# Patient Record
Sex: Male | Born: 1950 | Race: White | Hispanic: No | Marital: Married | Smoking: Never smoker
Health system: Southern US, Community
[De-identification: ages and names within clinical notes are randomized; demographics above are authoritative.]

## PROBLEM LIST (undated history)

## (undated) DIAGNOSIS — Z0000 Encounter for general adult medical examination without abnormal findings: Secondary | ICD-10-CM

## (undated) DIAGNOSIS — N529 Male erectile dysfunction, unspecified: Secondary | ICD-10-CM

## (undated) DIAGNOSIS — E785 Hyperlipidemia, unspecified: Secondary | ICD-10-CM

## (undated) DIAGNOSIS — B029 Zoster without complications: Secondary | ICD-10-CM

## (undated) DIAGNOSIS — Z8709 Personal history of other diseases of the respiratory system: Secondary | ICD-10-CM

## (undated) HISTORY — PX: WISDOM TOOTH EXTRACTION: SHX21

## (undated) HISTORY — PX: NO PAST SURGERIES: SHX2092

## (undated) HISTORY — DX: Hyperlipidemia, unspecified: E78.5

## (undated) HISTORY — DX: Encounter for general adult medical examination without abnormal findings: Z00.00

## (undated) HISTORY — PX: COLONOSCOPY: SHX174

## (undated) HISTORY — DX: Zoster without complications: B02.9

## (undated) HISTORY — DX: Male erectile dysfunction, unspecified: N52.9

## (undated) HISTORY — DX: Personal history of other diseases of the respiratory system: Z87.09

---

## 2009-02-24 ENCOUNTER — Encounter: Admission: RE | Admit: 2009-02-24 | Discharge: 2009-02-24 | Payer: Self-pay | Admitting: Cardiology

## 2009-04-04 LAB — HM COLONOSCOPY

## 2010-09-10 ENCOUNTER — Telehealth: Payer: Self-pay | Admitting: Cardiology

## 2010-09-10 NOTE — Telephone Encounter (Signed)
Pt is leaving town late Monday afternoon to go out of town for 10 days and needs two Z Paks called into pharmacy prior to his leaving.   Pharmacy is [facility] on [address]  Pt would like a call back to confirm this has been done.  Pt is aware since after 4pm may not receive call until tomorrow.

## 2010-09-11 NOTE — Telephone Encounter (Signed)
For him and Candace, ? Ok to Rx

## 2010-09-12 NOTE — Telephone Encounter (Signed)
Yes okay to prescribe Z-Pak for [place] and for [employer]

## 2010-09-14 MED ORDER — AZITHROMYCIN 250 MG PO TABS: ORAL_TABLET | ORAL | Status: DC

## 2010-09-14 NOTE — Telephone Encounter (Signed)
Called in at request of patient

## 2010-12-03 ENCOUNTER — Encounter: Payer: Self-pay | Admitting: *Deleted

## 2010-12-04 ENCOUNTER — Other Ambulatory Visit: Payer: Self-pay | Admitting: *Deleted

## 2010-12-04 ENCOUNTER — Other Ambulatory Visit (INDEPENDENT_AMBULATORY_CARE_PROVIDER_SITE_OTHER): Payer: PRIVATE HEALTH INSURANCE | Admitting: *Deleted

## 2010-12-04 ENCOUNTER — Other Ambulatory Visit: Payer: Self-pay | Admitting: Cardiology

## 2010-12-04 DIAGNOSIS — E7800 Pure hypercholesterolemia, unspecified: Secondary | ICD-10-CM

## 2010-12-04 LAB — HEPATIC FUNCTION PANEL
ALT: 20 U/L (ref 0–53)
AST: 24 U/L (ref 0–37)
Albumin: 4.2 g/dL (ref 3.5–5.2)
Alkaline Phosphatase: 84 U/L (ref 39–117)
Bilirubin, Direct: 0.1 mg/dL (ref 0.0–0.3)
Total Bilirubin: 0.7 mg/dL (ref 0.3–1.2)
Total Protein: 7.3 g/dL (ref 6.0–8.3)

## 2010-12-04 LAB — LIPID PANEL
Cholesterol: 225 mg/dL — ABNORMAL HIGH (ref 0–200)
Triglycerides: 218 mg/dL — ABNORMAL HIGH (ref 0.0–149.0)
VLDL: 43.6 mg/dL — ABNORMAL HIGH (ref 0.0–40.0)

## 2010-12-04 LAB — BASIC METABOLIC PANEL
BUN: 15 mg/dL (ref 6–23)
Calcium: 9.1 mg/dL (ref 8.4–10.5)
Creatinine, Ser: 1 mg/dL (ref 0.4–1.5)
GFR: 85.8 mL/min (ref >60.00)
Glucose, Bld: 96 mg/dL (ref 70–99)
Potassium: 3.8 mEq/L (ref 3.5–5.1)

## 2010-12-04 LAB — LDL CHOLESTEROL, DIRECT: Direct LDL: 145.6 mg/dL

## 2010-12-07 ENCOUNTER — Encounter: Payer: Self-pay | Admitting: Cardiology

## 2010-12-07 ENCOUNTER — Other Ambulatory Visit: Payer: Self-pay | Admitting: *Deleted

## 2010-12-07 ENCOUNTER — Ambulatory Visit (INDEPENDENT_AMBULATORY_CARE_PROVIDER_SITE_OTHER): Payer: BC Managed Care – PPO | Admitting: Cardiology

## 2010-12-07 VITALS — BP 118/86 | HR 70 | Ht 72.0 in | Wt 226.0 lb

## 2010-12-07 DIAGNOSIS — E785 Hyperlipidemia, unspecified: Secondary | ICD-10-CM

## 2010-12-07 DIAGNOSIS — E7800 Pure hypercholesterolemia, unspecified: Secondary | ICD-10-CM

## 2010-12-07 DIAGNOSIS — R05 Cough: Secondary | ICD-10-CM

## 2010-12-07 NOTE — Assessment & Plan Note (Signed)
The patient has a history of dyslipidemia.  Since I last saw him his cholesterol and triglyceride numbers have increased and his weight is up 6 pounds.

## 2010-12-07 NOTE — Assessment & Plan Note (Signed)
The patient has had a cough for several weeks.  Is not bringing up any sputum.  He is not on an ACE inhibitor.  We will plan to get a CBC today and a chest x-ray later this week.

## 2010-12-07 NOTE — Progress Notes (Signed)
Mario Young Date of Birth:  [birth year] [facility] [address]   [address] [place], [place]  [address] [phone]           Fax   [phone]  History of Present Illness: This pleasant 60 year old gentleman is seen after a long absence.  He has a history of hypercholesterolemia and exogenous obesity.  He does not have any history of ischemic heart disease.  He had a normal Cardiolite stress test in 2001.  He gets regular exercise working out and also plays golf.  When he does play golf he walks and carries his bag.  Current Outpatient Prescriptions  Medication Sig Dispense Refill  . aspirin 81 MG tablet Take 81 mg by mouth daily.        . Multiple Vitamin (MULTIVITAMIN) tablet Take 1 tablet by mouth daily.          Allergies  Allergen Reactions  . Lopressor (Metoprolol Tartrate)     nausea    There is no problem list on file for this patient.   History  Smoking status  . Never Smoker   Smokeless tobacco  . Not on file    History  Alcohol Use     No family history on file.  Review of Systems: Constitutional: no fever chills diaphoresis or fatigue or change in weight.  Head and neck: no hearing loss, no epistaxis, no photophobia or visual disturbance. Respiratory: No cough, shortness of breath or wheezing. Cardiovascular: No chest pain peripheral edema, palpitations. Gastrointestinal: No abdominal distention, no abdominal pain, no change in bowel habits hematochezia or melena. Genitourinary: No dysuria, no frequency, no urgency, no nocturia. Musculoskeletal:No arthralgias, no back pain, no gait disturbance or myalgias. Neurological: No dizziness, no headaches, no numbness, no seizures, no syncope, no weakness, no tremors. Hematologic: No lymphadenopathy, no easy bruising. Psychiatric: No confusion, no hallucinations, no sleep disturbance.    Physical Exam: Filed Vitals:   12/07/10 1544  BP: 118/86  Pulse: 70   the general  appearance reveals a well-developed well-nourished gentleman in no distress.Pupils equal and reactive.   Extraocular Movements are full.  There is no scleral icterus.  The mouth and pharynx are normal.  The neck is supple.  The carotids reveal no bruits.  The jugular venous pressure is normal.  The thyroid is not enlarged.  There is no lymphadenopathy.  The chest is clear to percussion and auscultation. There are no rales or rhonchi. Expansion of the chest is symmetrical.  The precordium is quiet.  The first heart sound is normal.  The second heart sound is physiologically split.  There is no murmur gallop rub or click.  There is no abnormal lift or heave.  The abdomen is soft and nontender. Bowel sounds are normal. The liver and spleen are not enlarged. There Are no abdominal masses. There are no bruits.  Rectal exam reveals normal prostate stool is brown and heme-negative.  Normal extremity.  Good pedal pulses and no phlebitis or edema. Strength is normal and symmetrical in all extremities.  There is no lateralizing weakness.  There are no sensory deficits.  The skin is warm and dry.  There is no rash.  EKG shows normal sinus rhythm and is within normal limits   Assessment / Plan:   good general health.  He does have significant elevation of his lipids.  He would like to try harder on weight loss and diet before going on medication.  He worked hard over the  next 6 months to get his weight down and cut back on carbohydrates and also avoid high cholesterol foods.  Recheck in 6 months for followup office visit and lipid panel.  He will also be seeing about getting a primary care physician for his noncardiac needs

## 2010-12-07 NOTE — Patient Instructions (Addendum)
Your physician recommends that you continue on your current medications as directed. Please refer to the Current Medication list given to you today. Your physician wants you to follow-up in: 6 month You will receive a reminder letter in the mail two months in advance. If you don't receive a letter, please call our office to schedule the follow-up appointment. Will obtain lab today and chest xray Friday and call with the results

## 2010-12-08 ENCOUNTER — Telehealth: Payer: Self-pay | Admitting: *Deleted

## 2010-12-08 LAB — CBC WITH DIFFERENTIAL/PLATELET
Basophils Absolute: 0.1 10*3/uL (ref 0.0–0.1)
Basophils Relative: 0.9 % (ref 0.0–3.0)
Eosinophils Absolute: 0.2 10*3/uL (ref 0.0–0.7)
Hemoglobin: 15.6 g/dL (ref 13.0–17.0)
Neutro Abs: 4.1 10*3/uL (ref 1.4–7.7)
RBC: 5.08 Mil/uL (ref 4.22–5.81)

## 2010-12-08 NOTE — Telephone Encounter (Signed)
Message copied by Burnell Blanks on Tue Dec 08, 2010  4:28 PM ------      Message from: Cassell Clement      Created: Tue Dec 08, 2010  4:01 PM       Please report.  The PSA is still normal at 1.20.  The CBC is normal.  There is no anemia

## 2010-12-08 NOTE — Telephone Encounter (Signed)
Advised wife of results.  

## 2010-12-11 ENCOUNTER — Ambulatory Visit
Admission: RE | Admit: 2010-12-11 | Discharge: 2010-12-11 | Disposition: A | Discharge status: Home / Self Care | Payer: BC Managed Care – PPO | Source: Ambulatory Visit | Attending: Cardiology | Admitting: Cardiology

## 2010-12-11 DIAGNOSIS — R05 Cough: Secondary | ICD-10-CM

## 2010-12-15 ENCOUNTER — Telehealth: Payer: Self-pay | Admitting: *Deleted

## 2010-12-15 NOTE — Telephone Encounter (Signed)
Advised of xray 

## 2010-12-15 NOTE — Telephone Encounter (Signed)
Message copied by Burnell Blanks on Tue Dec 15, 2010  2:06 PM ------      Message from: Cassell Clement      Created: Mon Dec 14, 2010 10:31 AM       Please report.  The chest x-ray was normal

## 2011-04-23 ENCOUNTER — Telehealth: Payer: Self-pay | Admitting: Cardiology

## 2011-04-23 DIAGNOSIS — R05 Cough: Secondary | ICD-10-CM

## 2011-04-23 MED ORDER — HYDROCOD POLST-CHLORPHEN POLST 10-8 MG/5ML PO LQCR: 5.0000 mL | Freq: Two times a day (BID) | ORAL | Status: DC | PRN

## 2011-04-23 MED ORDER — AZITHROMYCIN 250 MG PO TABS: ORAL_TABLET | ORAL | Status: AC

## 2011-04-23 NOTE — Telephone Encounter (Signed)
Advised patient

## 2011-04-23 NOTE — Telephone Encounter (Signed)
Last Saturday went to [facility] and gave prednisone shot and then 2 tabs a day for 5 days along with Doxycycline 100 mg twice a day for 10 days.  Is still coughing. Non productive cough, using Mucinex.  Only taking Doxycycline now and is not really feeling any better.  Was hoping  Dr. Patty Sermons could Rx something for cough and/or something stronger.  Will forward to  Dr. Patty Sermons for review

## 2011-04-23 NOTE — Telephone Encounter (Signed)
Pt is having a upper resp situation and needs meds called in

## 2011-04-23 NOTE — Telephone Encounter (Signed)
Keep using mucinex and add Tussionex 5 cc q12h prn.  Stop doxy and switch to Zpak

## 2011-06-25 ENCOUNTER — Telehealth: Payer: Self-pay | Admitting: Cardiology

## 2011-06-25 NOTE — Telephone Encounter (Signed)
New msg Pt wants a rx for prednisone for insect bites. Please call

## 2011-06-25 NOTE — Telephone Encounter (Signed)
Reviewed with Norma Fredrickson, NP- per Lawson Fiscal, the patient will need to go to Urgent Care for this to be done. I have left a message on the patient's identified voice mail that Dr. Patty Sermons is out of the office today and that his NP states he will need to obtain this through Urgent Care. Sherri Rad, RN, BSN  The patient called back very upset that we would not fill this for him. I reinterated to him that we are cardiology and in Dr. Yevonne Pax abscence, this would not be ordered by any of our other doctors. I re-instructed him to visit his local Urgent Care. He did hang up on me. Sherri Rad, RN, BSN

## 2011-06-28 ENCOUNTER — Telehealth: Payer: Self-pay | Admitting: Cardiology

## 2011-06-28 MED ORDER — AZITHROMYCIN 250 MG PO TABS: ORAL_TABLET | ORAL | Status: AC

## 2011-06-28 MED ORDER — METHYLPREDNISOLONE 4 MG PO KIT: PACK | ORAL | Status: AC

## 2011-06-28 NOTE — Telephone Encounter (Signed)
Pt needs a Rx for prednisone he call ed Friday and spoke with Herbert Seta and was told he had to wait til you got back

## 2011-06-28 NOTE — Telephone Encounter (Signed)
Fu call °Patient returning your call °

## 2011-06-28 NOTE — Telephone Encounter (Signed)
Okay to call in a Medrol Dosepak.

## 2011-06-28 NOTE — Telephone Encounter (Signed)
Will forward to Dr Patty Sermons for review since patient has called back requested today.  Sherri Rad, RN 06/25/2011 10:33 AM Signed  Reviewed with Norma Fredrickson, NP- per Lawson Fiscal, the patient will need to go to Urgent Care for this to be done. I have left a message on the patient's identified voice mail that Dr. Patty Sermons is out of the office today and that his NP states he will need to obtain this through Urgent Care. Sherri Rad, RN, BSN  The patient called back very upset that we would not fill this for him. I reinterated to him that we are cardiology and in Dr. Yevonne Pax abscence, this would not be ordered by any of our other doctors. I re-instructed him to visit his local Urgent Care. He did hang up on me. Sherri Rad, RN, BSN   Melissa S Hethcox 06/25/2011 9:03 AM Signed  New msg  Pt wants a rx for prednisone for insect bites. Please call

## 2011-06-28 NOTE — Telephone Encounter (Signed)
Left message to call back  

## 2011-06-28 NOTE — Telephone Encounter (Signed)
Called in Medrol to [facility].  Patient also requested Zpak for him and his wife to have on hand since they are going out of town for a few weeks.  Called to pharmacy as well

## 2012-04-24 IMAGING — CR DG CHEST 2V
2 series · 2 of 2 positions shown · non-contrast
Comparison: 02/24/2010.

CLINICAL DATA: Cough.  Physical.  Nonsmoker.

CHEST - 2 VIEW

[view not recorded (1 of 2)]
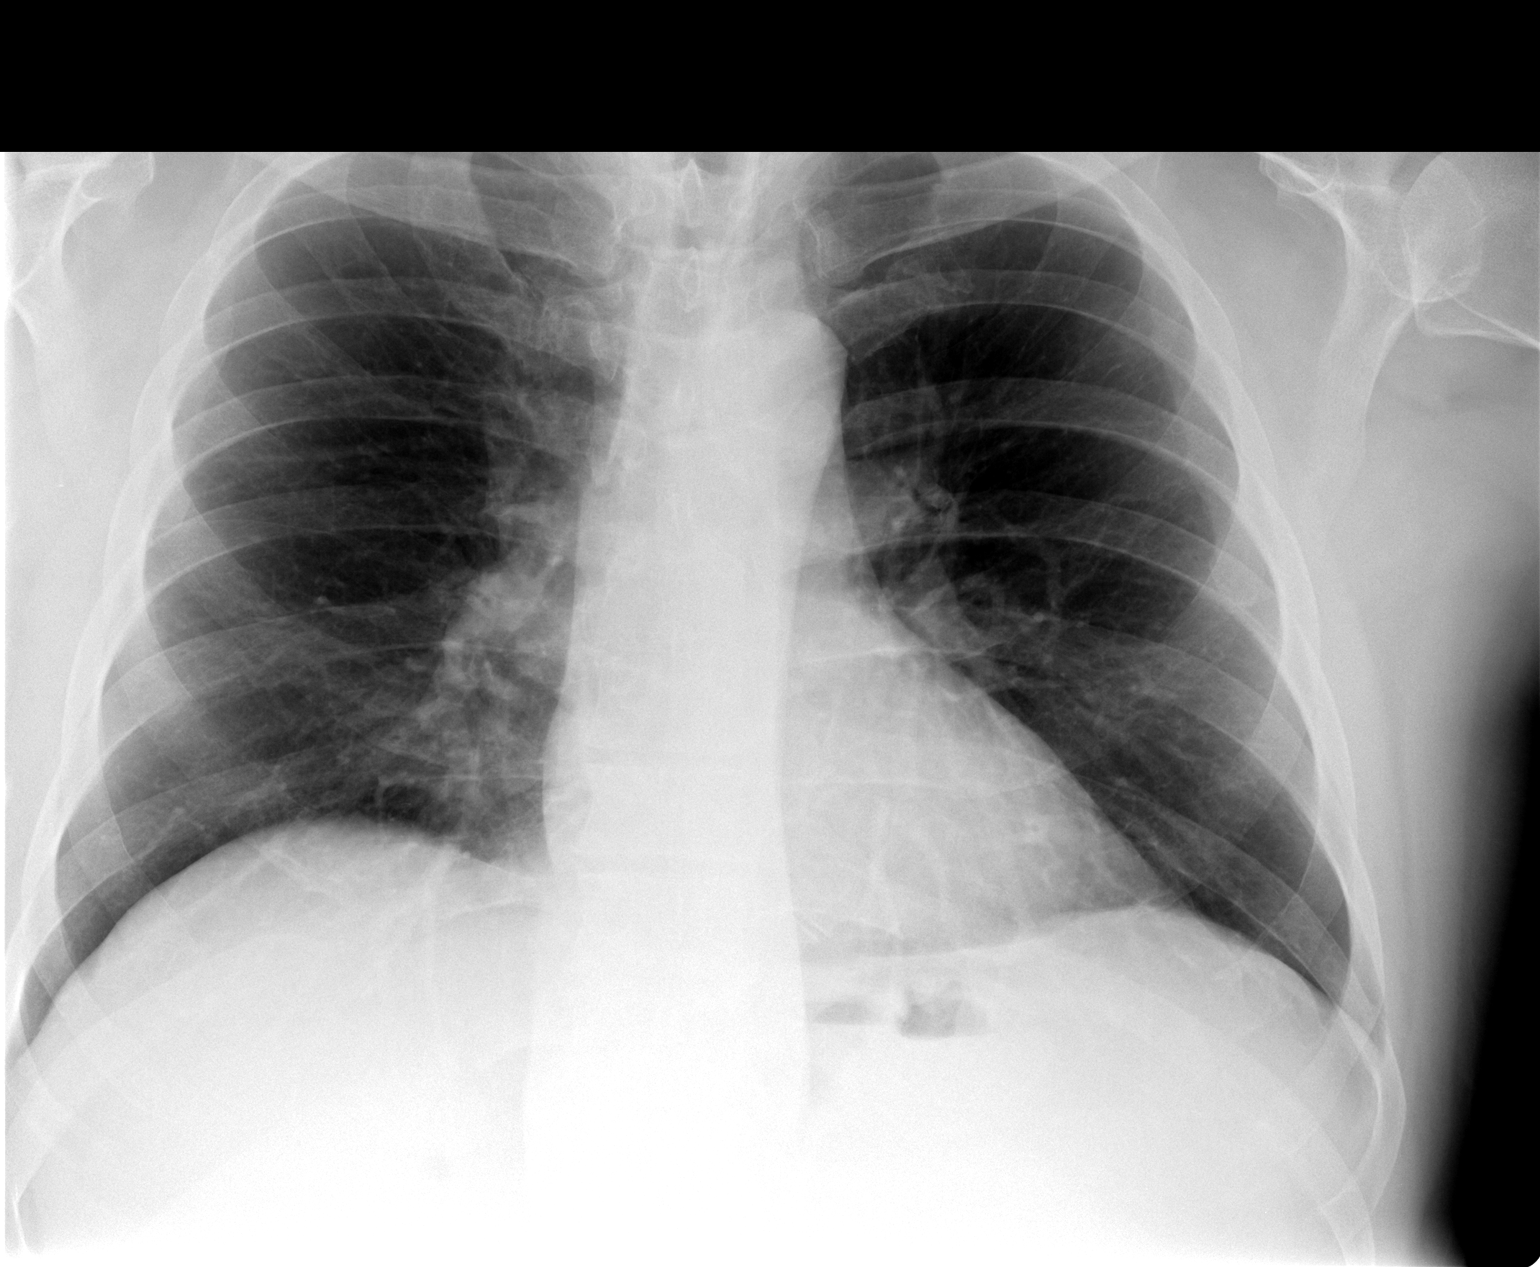

[view not recorded (2 of 2)]
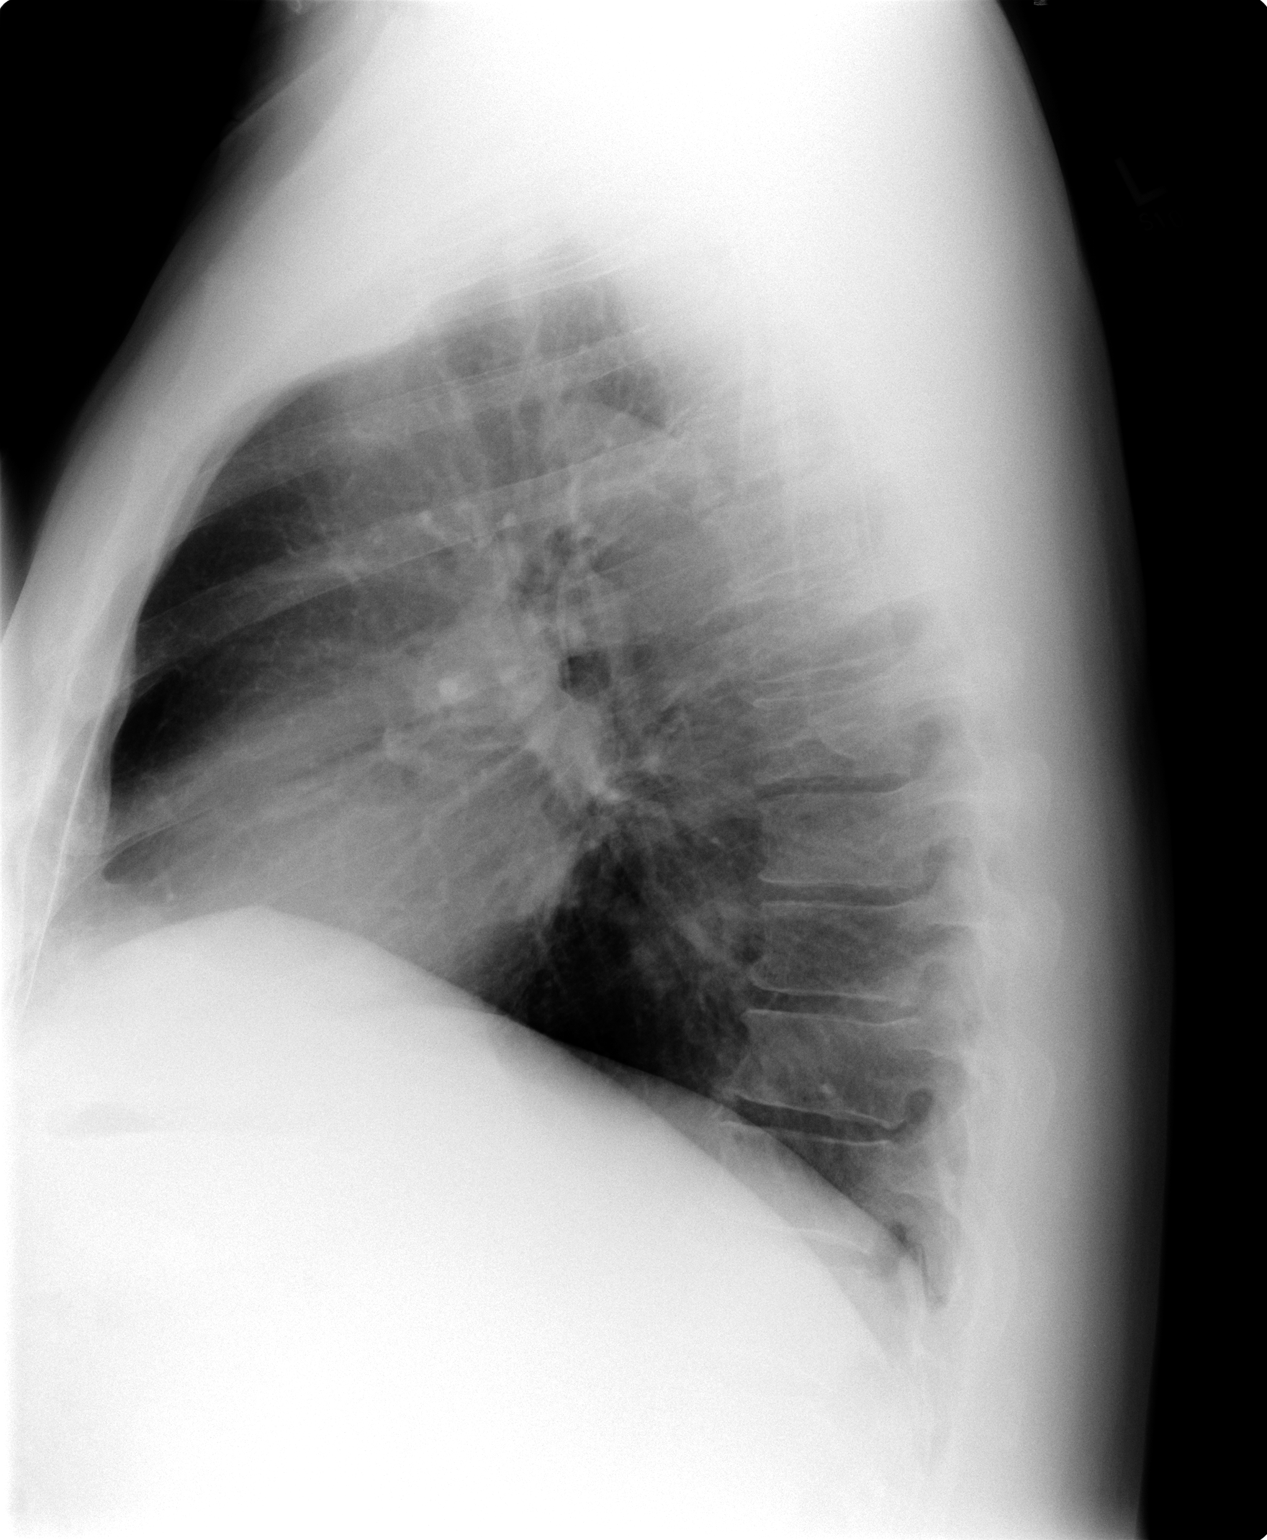

[2 of 2 positions shown; findings below may reference images not displayed]

FINDINGS: Mild central pulmonary vascular prominence. No
infiltrate, congestive heart failure or pneumothorax.  Heart size
within normal limits.
IMPRESSION: No acute abnormality.

## 2012-07-11 ENCOUNTER — Encounter: Payer: Self-pay | Admitting: Cardiology

## 2012-09-28 ENCOUNTER — Encounter: Payer: Self-pay | Admitting: Internal Medicine

## 2012-12-15 ENCOUNTER — Encounter: Payer: BC Managed Care – PPO | Admitting: Internal Medicine

## 2013-01-02 ENCOUNTER — Encounter: Payer: Self-pay | Admitting: Internal Medicine

## 2013-02-28 ENCOUNTER — Encounter: Payer: BC Managed Care – PPO | Admitting: Internal Medicine

## 2013-07-16 ENCOUNTER — Encounter: Payer: Self-pay | Admitting: Internal Medicine

## 2013-10-03 ENCOUNTER — Encounter: Payer: BC Managed Care – PPO | Admitting: Internal Medicine

## 2013-11-06 ENCOUNTER — Telehealth: Payer: Self-pay | Admitting: Internal Medicine

## 2013-11-06 NOTE — Telephone Encounter (Signed)
Received medical records from [facility], 14 pages, sent to Dr. Olevia Perches. 11/06/13/ss

## 2013-11-07 ENCOUNTER — Telehealth: Payer: Self-pay | Admitting: Internal Medicine

## 2013-11-13 NOTE — Telephone Encounter (Signed)
Lm for pt to call to sched Direct colon with Dr. Olevia Perches  Records approved

## 2014-01-14 NOTE — Telephone Encounter (Signed)
Pt scheduled then cancelled °

## 2014-04-19 ENCOUNTER — Telehealth: Payer: Self-pay | Admitting: *Deleted

## 2014-04-19 NOTE — Telephone Encounter (Addendum)
Pre visit completed under specialty comments.

## 2014-04-22 ENCOUNTER — Encounter: Payer: Self-pay | Admitting: Family Medicine

## 2014-04-22 ENCOUNTER — Encounter: Payer: Self-pay | Admitting: Gastroenterology

## 2014-04-22 ENCOUNTER — Ambulatory Visit: Payer: Self-pay | Admitting: Family Medicine

## 2014-04-22 ENCOUNTER — Ambulatory Visit (INDEPENDENT_AMBULATORY_CARE_PROVIDER_SITE_OTHER): Payer: Managed Care, Other (non HMO) | Admitting: Family Medicine

## 2014-04-22 VITALS — BP 132/82 | HR 60 | Temp 98.4°F | Ht 72.0 in | Wt 181.2 lb

## 2014-04-22 DIAGNOSIS — Z86010 Personal history of colonic polyps: Secondary | ICD-10-CM

## 2014-04-22 DIAGNOSIS — Z8709 Personal history of other diseases of the respiratory system: Secondary | ICD-10-CM

## 2014-04-22 DIAGNOSIS — E785 Hyperlipidemia, unspecified: Secondary | ICD-10-CM

## 2014-04-22 DIAGNOSIS — N529 Male erectile dysfunction, unspecified: Secondary | ICD-10-CM

## 2014-04-22 DIAGNOSIS — Z0000 Encounter for general adult medical examination without abnormal findings: Secondary | ICD-10-CM | POA: Diagnosis not present

## 2014-04-22 HISTORY — DX: Personal history of other diseases of the respiratory system: Z87.09

## 2014-04-22 MED ORDER — AZITHROMYCIN 250 MG PO TABS: ORAL_TABLET | ORAL | Status: DC

## 2014-04-22 NOTE — Telephone Encounter (Signed)
Error

## 2014-04-22 NOTE — Progress Notes (Signed)
Pre visit review using our clinic review tool, if applicable. No additional management support is needed unless otherwise documented below in the visit note. 

## 2014-04-22 NOTE — Patient Instructions (Signed)
Release of rec Dr Brigitte Pulse at [facility] A healthy lifestyle and preventive care can promote health and wellness. Preventive health guidelines for men include the following key practices:  A routine yearly physical is a good way to check with your health care provider about your health and preventative screening. It is a chance to share any concerns and updates on your health and to receive a thorough exam.  Visit your dentist for a routine exam and preventative care every 6 months. Brush your teeth twice a day and floss once a day. Good oral hygiene prevents tooth decay and gum disease.  The frequency of eye exams is based on your age, health, family medical history, use of contact lenses, and other factors. Follow your health care provider's recommendations for frequency of eye exams.  Eat a healthy diet. Foods such as vegetables, fruits, whole grains, low-fat dairy products, and lean protein foods contain the nutrients you need without too many calories. Decrease your intake of foods high in solid fats, added sugars, and salt. Eat the right amount of calories for you.Get information about a proper diet from your health care provider, if necessary.  Regular physical exercise is one of the most important things you can do for your health. Most adults should get at least 150 minutes of moderate-intensity exercise (any activity that increases your heart rate and causes you to sweat) each week. In addition, most adults need muscle-strengthening exercises on 2 or more days a week.  Maintain a healthy weight. The body mass index (BMI) is a screening tool to identify possible weight problems. It provides an estimate of body fat based on height and weight. Your health care provider can find your BMI and can help you achieve or maintain a healthy weight.For adults 20 years and older:  A BMI below 18.5 is considered underweight.  A BMI of 18.5 to 24.9 is normal.  A BMI of 25 to  29.9 is considered overweight.  A BMI of 30 and above is considered obese.  Maintain normal blood lipids and cholesterol levels by exercising and minimizing your intake of saturated fat. Eat a balanced diet with plenty of fruit and vegetables. Blood tests for lipids and cholesterol should begin at age 96 and be repeated every 5 years. If your lipid or cholesterol levels are high, you are over 50, or you are at high risk for heart disease, you may need your cholesterol levels checked more frequently.Ongoing high lipid and cholesterol levels should be treated with medicines if diet and exercise are not working.  If you smoke, find out from your health care provider how to quit. If you do not use tobacco, do not start.  Lung cancer screening is recommended for adults aged 51-80 years who are at high risk for developing lung cancer because of a history of smoking. A yearly low-dose CT scan of the lungs is recommended for people who have at least a 30-pack-year history of smoking and are a current smoker or have quit within the past 15 years. A pack year of smoking is smoking an average of 1 pack of cigarettes a day for 1 year (for example: 1 pack a day for 30 years or 2 packs a day for 15 years). Yearly screening should continue until the smoker has stopped smoking for at least 15 years. Yearly screening should be stopped for people who develop a health problem that would prevent them from having lung cancer treatment.  If  you choose to drink alcohol, do not have more than 2 drinks per day. One drink is considered to be 12 ounces (355 mL) of beer, 5 ounces (148 mL) of wine, or 1.5 ounces (44 mL) of liquor.  Avoid use of street drugs. Do not share needles with anyone. Ask for help if you need support or instructions about stopping the use of drugs.  High blood pressure causes heart disease and increases the risk of stroke. Your blood pressure should be checked at least every 1-2 years. Ongoing high blood  pressure should be treated with medicines, if weight loss and exercise are not effective.  If you are 20-39 years old, ask your health care provider if you should take aspirin to prevent heart disease.  Diabetes screening involves taking a blood sample to check your fasting blood sugar level. This should be done once every 3 years, after age 26, if you are within normal weight and without risk factors for diabetes. Testing should be considered at a younger age or be carried out more frequently if you are overweight and have at least 1 risk factor for diabetes.  Colorectal cancer can be detected and often prevented. Most routine colorectal cancer screening begins at the age of 30 and continues through age 37. However, your health care provider may recommend screening at an earlier age if you have risk factors for colon cancer. On a yearly basis, your health care provider may provide home test kits to check for hidden blood in the stool. Use of a small camera at the end of a tube to directly examine the colon (sigmoidoscopy or colonoscopy) can detect the earliest forms of colorectal cancer. Talk to your health care provider about this at age 83, when routine screening begins. Direct exam of the colon should be repeated every 5-10 years through age 69, unless early forms of precancerous polyps or small growths are found.  People who are at an increased risk for hepatitis B should be screened for this virus. You are considered at high risk for hepatitis B if:  You were born in a country where hepatitis B occurs often. Talk with your health care provider about which countries are considered high risk.  Your parents were born in a high-risk country and you have not received a shot to protect against hepatitis B (hepatitis B vaccine).  You have HIV or AIDS.  You use needles to inject street drugs.  You live with, or have sex with, someone who has hepatitis B.  You are a man who has sex with other men  (MSM).  You get hemodialysis treatment.  You take certain medicines for conditions such as cancer, organ transplantation, and autoimmune conditions.  Hepatitis C blood testing is recommended for all people born from [birth year] through 1965 and any individual with known risks for hepatitis C.  Practice safe sex. Use condoms and avoid high-risk sexual practices to reduce the spread of sexually transmitted infections (STIs). STIs include gonorrhea, chlamydia, syphilis, trichomonas, herpes, HPV, and human immunodeficiency virus (HIV). Herpes, HIV, and HPV are viral illnesses that have no cure. They can result in disability, cancer, and death.  If you are at risk of being infected with HIV, it is recommended that you take a prescription medicine daily to prevent HIV infection. This is called preexposure prophylaxis (PrEP). You are considered at risk if:  You are a man who has sex with other men (MSM) and have other risk factors.  You are a heterosexual man, are  sexually active, and are at increased risk for HIV infection.  You take drugs by injection.  You are sexually active with a partner who has HIV.  Talk with your health care provider about whether you are at high risk of being infected with HIV. If you choose to begin PrEP, you should first be tested for HIV. You should then be tested every 3 months for as long as you are taking PrEP.  A one-time screening for abdominal aortic aneurysm (AAA) and surgical repair of large AAAs by ultrasound are recommended for men ages 55 to 17 years who are current or former smokers.  Healthy men should no longer receive prostate-specific antigen (PSA) blood tests as part of routine cancer screening. Talk with your health care provider about prostate cancer screening.  Testicular cancer screening is not recommended for adult males who have no symptoms. Screening includes self-exam, a health care provider exam, and other screening tests. Consult with your health  care provider about any symptoms you have or any concerns you have about testicular cancer.  Use sunscreen. Apply sunscreen liberally and repeatedly throughout the day. You should seek shade when your shadow is shorter than you. Protect yourself by wearing long sleeves, pants, a wide-brimmed hat, and sunglasses year round, whenever you are outdoors.  Once a month, do a whole-body skin exam, using a mirror to look at the skin on your back. Tell your health care provider about new moles, moles that have irregular borders, moles that are larger than a pencil eraser, or moles that have changed in shape or color.  Stay current with required vaccines (immunizations).  Influenza vaccine. All adults should be immunized every year.  Tetanus, diphtheria, and acellular pertussis (Td, Tdap) vaccine. An adult who has not previously received Tdap or who does not know his vaccine status should receive 1 dose of Tdap. This initial dose should be followed by tetanus and diphtheria toxoids (Td) booster doses every 10 years. Adults with an unknown or incomplete history of completing a 3-dose immunization series with Td-containing vaccines should begin or complete a primary immunization series including a Tdap dose. Adults should receive a Td booster every 10 years.  Varicella vaccine. An adult without evidence of immunity to varicella should receive 2 doses or a second dose if he has previously received 1 dose.  Human papillomavirus (HPV) vaccine. Males aged 21-21 years who have not received the vaccine previously should receive the 3-dose series. Males aged 22-26 years may be immunized. Immunization is recommended through the age of 69 years for any male who has sex with males and did not get any or all doses earlier. Immunization is recommended for any person with an immunocompromised condition through the age of 76 years if he did not get any or all doses earlier. During the 3-dose series, the second dose should be  obtained 4-8 weeks after the first dose. The third dose should be obtained 24 weeks after the first dose and 16 weeks after the second dose.  Zoster vaccine. One dose is recommended for adults aged 52 years or older unless certain conditions are present.  Measles, mumps, and rubella (MMR) vaccine. Adults born before [birth year] generally are considered immune to measles and mumps. Adults born in [birth year] or later should have 1 or more doses of MMR vaccine unless there is a contraindication to the vaccine or there is laboratory evidence of immunity to each of the three diseases. A routine second dose of MMR vaccine should be obtained at  least 28 days after the first dose for students attending postsecondary schools, health care workers, or international travelers. People who received inactivated measles vaccine or an unknown type of measles vaccine during 1963-1967 should receive 2 doses of MMR vaccine. People who received inactivated mumps vaccine or an unknown type of mumps vaccine before 1979 and are at high risk for mumps infection should consider immunization with 2 doses of MMR vaccine. Unvaccinated health care workers born before [birth year] who lack laboratory evidence of measles, mumps, or rubella immunity or laboratory confirmation of disease should consider measles and mumps immunization with 2 doses of MMR vaccine or rubella immunization with 1 dose of MMR vaccine.  Pneumococcal 13-valent conjugate (PCV13) vaccine. When indicated, a person who is uncertain of his immunization history and has no record of immunization should receive the PCV13 vaccine. An adult aged 19 years or older who has certain medical conditions and has not been previously immunized should receive 1 dose of PCV13 vaccine. This PCV13 should be followed with a dose of pneumococcal polysaccharide (PPSV23) vaccine. The PPSV23 vaccine dose should be obtained at least 8 weeks after the dose of PCV13 vaccine. An adult aged 62 years or older who has  certain medical conditions and previously received 1 or more doses of PPSV23 vaccine should receive 1 dose of PCV13. The PCV13 vaccine dose should be obtained 1 or more years after the last PPSV23 vaccine dose.  Pneumococcal polysaccharide (PPSV23) vaccine. When PCV13 is also indicated, PCV13 should be obtained first. All adults aged 76 years and older should be immunized. An adult younger than age 72 years who has certain medical conditions should be immunized. Any person who resides in a nursing home or long-term care facility should be immunized. An adult smoker should be immunized. People with an immunocompromised condition and certain other conditions should receive both PCV13 and PPSV23 vaccines. People with human immunodeficiency virus (HIV) infection should be immunized as soon as possible after diagnosis. Immunization during chemotherapy or radiation therapy should be avoided. Routine use of PPSV23 vaccine is not recommended for American Indians, [place]atives, or people younger than 65 years unless there are medical conditions that require PPSV23 vaccine. When indicated, people who have unknown immunization and have no record of immunization should receive PPSV23 vaccine. One-time revaccination 5 years after the first dose of PPSV23 is recommended for people aged 19-64 years who have chronic kidney failure, nephrotic syndrome, asplenia, or immunocompromised conditions. People who received 1-2 doses of PPSV23 before age 53 years should receive another dose of PPSV23 vaccine at age 79 years or later if at least 5 years have passed since the previous dose. Doses of PPSV23 are not needed for people immunized with PPSV23 at or after age 71 years.  Meningococcal vaccine. Adults with asplenia or persistent complement component deficiencies should receive 2 doses of quadrivalent meningococcal conjugate (MenACWY-D) vaccine. The doses should be obtained at least 2 months apart. Microbiologists working with  certain meningococcal bacteria, [employer] recruits, people at risk during an outbreak, and people who travel to or live in countries with a high rate of meningitis should be immunized. A first-year college student up through age 41 years who is living in a residence hall should receive a dose if he did not receive a dose on or after his 16th birthday. Adults who have certain high-risk conditions should receive one or more doses of vaccine.  Hepatitis A vaccine. Adults who wish to be protected from this disease, have certain high-risk conditions, work  with hepatitis A-infected animals, work in hepatitis A research labs, or travel to or work in countries with a high rate of hepatitis A should be immunized. Adults who were previously unvaccinated and who anticipate close contact with an international adoptee during the first 60 days after arrival in the [place] from a country with a high rate of hepatitis A should be immunized.  Hepatitis B vaccine. Adults should be immunized if they wish to be protected from this disease, have certain high-risk conditions, may be exposed to blood or other infectious body fluids, are household contacts or sex partners of hepatitis B positive people, are clients or workers in certain care facilities, or travel to or work in countries with a high rate of hepatitis B.  Haemophilus influenzae type b (Hib) vaccine. A previously unvaccinated person with asplenia or sickle cell disease or having a scheduled splenectomy should receive 1 dose of Hib vaccine. Regardless of previous immunization, a recipient of a hematopoietic stem cell transplant should receive a 3-dose series 6-12 months after his successful transplant. Hib vaccine is not recommended for adults with HIV infection. Preventive Service / Frequency Ages 90 to 32  Blood pressure check.** / Every 1 to 2 years.  Lipid and cholesterol check.** / Every 5 years beginning at age 68.  Hepatitis C blood test.** / For any  individual with known risks for hepatitis C.  Skin self-exam. / Monthly.  Influenza vaccine. / Every year.  Tetanus, diphtheria, and acellular pertussis (Tdap, Td) vaccine.** / Consult your health care provider. 1 dose of Td every 10 years.  Varicella vaccine.** / Consult your health care provider.  HPV vaccine. / 3 doses over 6 months, if 1 or younger.  Measles, mumps, rubella (MMR) vaccine.** / You need at least 1 dose of MMR if you were born in 1957 or later. You may also need a second dose.  Pneumococcal 13-valent conjugate (PCV13) vaccine.** / Consult your health care provider.  Pneumococcal polysaccharide (PPSV23) vaccine.** / 1 to 2 doses if you smoke cigarettes or if you have certain conditions.  Meningococcal vaccine.** / 1 dose if you are age 19 to 27 years and a Market researcher living in a residence hall, or have one of several medical conditions. You may also need additional booster doses.  Hepatitis A vaccine.** / Consult your health care provider.  Hepatitis B vaccine.** / Consult your health care provider.  Haemophilus influenzae type b (Hib) vaccine.** / Consult your health care provider. Ages 52 to 42  Blood pressure check.** / Every 1 to 2 years.  Lipid and cholesterol check.** / Every 5 years beginning at age 21.  Lung cancer screening. / Every year if you are aged 75-80 years and have a 30-pack-year history of smoking and currently smoke or have quit within the past 15 years. Yearly screening is stopped once you have quit smoking for at least 15 years or develop a health problem that would prevent you from having lung cancer treatment.  Fecal occult blood test (FOBT) of stool. / Every year beginning at age 46 and continuing until age 24. You may not have to do this test if you get a colonoscopy every 10 years.  Flexible sigmoidoscopy** or colonoscopy.** / Every 5 years for a flexible sigmoidoscopy or every 10 years for a colonoscopy beginning at age  26 and continuing until age 61.  Hepatitis C blood test.** / For all people born from [birth year] through 1965 and any individual with known risks for hepatitis  C.  Skin self-exam. / Monthly.  Influenza vaccine. / Every year.  Tetanus, diphtheria, and acellular pertussis (Tdap/Td) vaccine.** / Consult your health care provider. 1 dose of Td every 10 years.  Varicella vaccine.** / Consult your health care provider.  Zoster vaccine.** / 1 dose for adults aged 54 years or older.  Measles, mumps, rubella (MMR) vaccine.** / You need at least 1 dose of MMR if you were born in 1957 or later. You may also need a second dose.  Pneumococcal 13-valent conjugate (PCV13) vaccine.** / Consult your health care provider.  Pneumococcal polysaccharide (PPSV23) vaccine.** / 1 to 2 doses if you smoke cigarettes or if you have certain conditions.  Meningococcal vaccine.** / Consult your health care provider.  Hepatitis A vaccine.** / Consult your health care provider.  Hepatitis B vaccine.** / Consult your health care provider.  Haemophilus influenzae type b (Hib) vaccine.** / Consult your health care provider. Ages 72 and over  Blood pressure check.** / Every 1 to 2 years.  Lipid and cholesterol check.**/ Every 5 years beginning at age 6.  Lung cancer screening. / Every year if you are aged 40-80 years and have a 30-pack-year history of smoking and currently smoke or have quit within the past 15 years. Yearly screening is stopped once you have quit smoking for at least 15 years or develop a health problem that would prevent you from having lung cancer treatment.  Fecal occult blood test (FOBT) of stool. / Every year beginning at age 84 and continuing until age 45. You may not have to do this test if you get a colonoscopy every 10 years.  Flexible sigmoidoscopy** or colonoscopy.** / Every 5 years for a flexible sigmoidoscopy or every 10 years for a colonoscopy beginning at age 56 and continuing until age  58.  Hepatitis C blood test.** / For all people born from [birth year] through 1965 and any individual with known risks for hepatitis C.  Abdominal aortic aneurysm (AAA) screening.** / A one-time screening for ages 11 to 77 years who are current or former smokers.  Skin self-exam. / Monthly.  Influenza vaccine. / Every year.  Tetanus, diphtheria, and acellular pertussis (Tdap/Td) vaccine.** / 1 dose of Td every 10 years.  Varicella vaccine.** / Consult your health care provider.  Zoster vaccine.** / 1 dose for adults aged 68 years or older.  Pneumococcal 13-valent conjugate (PCV13) vaccine.** / Consult your health care provider.  Pneumococcal polysaccharide (PPSV23) vaccine.** / 1 dose for all adults aged 53 years and older.  Meningococcal vaccine.** / Consult your health care provider.  Hepatitis A vaccine.** / Consult your health care provider.  Hepatitis B vaccine.** / Consult your health care provider.  Haemophilus influenzae type b (Hib) vaccine.** / Consult your health care provider. **Family history and personal history of risk and conditions may change your health care provider's recommendations. Document Released: 02/16/2001 Document Revised: 12/26/2012 Document Reviewed: 05/18/2010 [facility] Patient Information 2015 [place], [place]. This information is not intended to replace advice given to you by your health care provider. Make sure you discuss any questions you have with your health care provider.

## 2014-04-23 ENCOUNTER — Encounter: Payer: Self-pay | Admitting: Internal Medicine

## 2014-04-26 LAB — FECAL OCCULT BLOOD, GUAIAC: Fecal Occult Blood: NEGATIVE

## 2014-04-28 ENCOUNTER — Encounter: Payer: Self-pay | Admitting: Family Medicine

## 2014-04-28 DIAGNOSIS — Z0000 Encounter for general adult medical examination without abnormal findings: Secondary | ICD-10-CM | POA: Insufficient documentation

## 2014-04-28 DIAGNOSIS — N529 Male erectile dysfunction, unspecified: Secondary | ICD-10-CM

## 2014-04-28 HISTORY — DX: Male erectile dysfunction, unspecified: N52.9

## 2014-04-28 HISTORY — DX: Encounter for general adult medical examination without abnormal findings: Z00.00

## 2014-04-28 NOTE — Assessment & Plan Note (Addendum)
Patient encouraged to maintain heart healthy diet, regular exercise, adequate sleep. Consider daily probiotics. Take medications as prescribed. Return for labs and in 1 year for annual exam or as needed. Referred for repeat screening colonoscopy

## 2014-04-28 NOTE — Progress Notes (Signed)
Mario Young  [id number] [birth year] 04/28/2014      Progress Note-Follow Up  Subjective  Chief Complaint  Chief Complaint  Patient presents with  . Establish Care    HPI  Patient is a 64 y.o. male in today for routine medical care. Patient is in today for annual exam and discussion about repeat screening colonoscopy. He is a new patient today. No recent illness. No acute complaints. In need of establishing care. Requesting a prescription for azithromycin to take when he travels as a precaution. Denies CP/palp/SOB/HA/congestion/fevers/GI or GU c/o. Taking meds as prescribed  Past Medical History  Diagnosis Date  . Hyperlipidemia   . H/O sinusitis 04/22/2014  . Preventative health care 04/28/2014  . Erectile dysfunction 04/28/2014    History reviewed. No pertinent past surgical history.  Family History  Problem Relation Age of Onset  . Heart disease Paternal Uncle   . Hyperlipidemia Mother   . Heart disease Father     CHF  . Kidney disease Father     History   Social History  . Marital Status: Married    Spouse Name: N/A  . Number of Children: 1  . Years of Education: 16   Occupational History  . sales    Social History Main Topics  . Smoking status: Never Smoker   . Smokeless tobacco: Not on file  . Alcohol Use: 0.0 oz/week    0 Standard drinks or equivalent per week     Comment: socially, weekends only  . Drug Use: No  . Sexual Activity: Yes     Comment: lives with wife, ltravels for work in [employer], mediterranean exercises   Other Topics Concern  . Not on file   Social History Narrative    Current Outpatient Prescriptions on File Prior to Visit  Medication Sig Dispense Refill  . aspirin 81 MG tablet Take 81 mg by mouth daily.      . Multiple Vitamin (MULTIVITAMIN) tablet Take 1 tablet by mouth daily.       No current facility-administered medications on file prior to visit.    No Active Allergies  Review of Systems  Review of  Systems  Constitutional: Negative for fever, chills and malaise/fatigue.  HENT: Negative for congestion, hearing loss and nosebleeds.   Eyes: Negative for discharge.  Respiratory: Negative for cough, sputum production, shortness of breath and wheezing.   Cardiovascular: Negative for chest pain, palpitations and leg swelling.  Gastrointestinal: Negative for heartburn, nausea, vomiting, abdominal pain, diarrhea, constipation and blood in stool.  Genitourinary: Negative for dysuria, urgency, frequency and hematuria.  Musculoskeletal: Negative for myalgias, back pain and falls.  Skin: Negative for rash.  Neurological: Negative for dizziness, tremors, sensory change, focal weakness, loss of consciousness, weakness and headaches.  Endo/Heme/Allergies: Negative for polydipsia. Does not bruise/bleed easily.  Psychiatric/Behavioral: Negative for depression and suicidal ideas. The patient is not nervous/anxious and does not have insomnia.     Objective  BP 132/82 mmHg  Pulse 60  Temp(Src) 98.4 F (36.9 C) (Oral)  Ht 6' (1.829 m)  Wt 181 lb 4 oz (82.214 kg)  BMI 24.58 kg/m2  SpO2 97%  Physical Exam  Physical Exam  Constitutional: He is oriented to person, place, and time and well-developed, well-nourished, and in no distress. No distress.  HENT:  Head: Normocephalic and atraumatic.  Eyes: Conjunctivae are normal.  Neck: Neck supple. No thyromegaly present.  Cardiovascular: Normal rate, regular rhythm and normal heart sounds.   No murmur heard. Pulmonary/Chest: Effort  normal and breath sounds normal. No respiratory distress.  Abdominal: He exhibits no distension and no mass. There is no tenderness.  Musculoskeletal: He exhibits no edema.  Neurological: He is alert and oriented to person, place, and time.  Skin: Skin is warm.  Psychiatric: Memory, affect and judgment normal.    No results found for: TSH Lab Results  Component Value Date   WBC 7.3 12/07/2010   HGB 15.6 12/07/2010    HCT 45.3 12/07/2010   MCV 89.3 12/07/2010   PLT 294.0 12/07/2010   Lab Results  Component Value Date   CREATININE 1.0 12/04/2010   BUN 15 12/04/2010   NA 139 12/04/2010   K 3.8 12/04/2010   CL 107 12/04/2010   CO2 24 12/04/2010   Lab Results  Component Value Date   ALT 20 12/04/2010   AST 24 12/04/2010   ALKPHOS 84 12/04/2010   BILITOT 0.7 12/04/2010   Lab Results  Component Value Date   CHOL 225* 12/04/2010   Lab Results  Component Value Date   HDL 51.60 12/04/2010   No results found for: [facility] [place] Lab Results  Component Value Date   TRIG 218.0* 12/04/2010   Lab Results  Component Value Date   CHOLHDL 4 12/04/2010     Assessment & Plan  Preventative health care Patient encouraged to maintain heart healthy diet, regular exercise, adequate sleep. Consider daily probiotics. Take medications as prescribed. Return for labs and in 1 year for annual exam or as needed. Referred for repeat screening colonoscopy   Dyslipidemia Encouraged heart healthy diet, increase exercise, avoid trans fats, consider a krill oil cap daily   Erectile dysfunction May continue Viagra prn

## 2014-04-28 NOTE — Assessment & Plan Note (Signed)
Encouraged heart healthy diet, increase exercise, avoid trans fats, consider a krill oil cap daily 

## 2014-04-28 NOTE — Assessment & Plan Note (Signed)
May continue Viagra prn

## 2014-06-04 ENCOUNTER — Ambulatory Visit (AMBULATORY_SURGERY_CENTER): Payer: Self-pay

## 2014-06-04 VITALS — Ht 71.0 in | Wt 178.2 lb

## 2014-06-04 DIAGNOSIS — Z8371 Family history of colonic polyps: Secondary | ICD-10-CM

## 2014-06-04 MED ORDER — MOVIPREP 100 G PO SOLR: ORAL | Status: DC

## 2014-06-04 NOTE — Progress Notes (Signed)
Per pt, no allergies to soy or egg products.Pt not taking any weight loss meds or using  O2 at home. 

## 2014-06-05 ENCOUNTER — Encounter: Payer: Self-pay | Admitting: Internal Medicine

## 2014-06-17 ENCOUNTER — Encounter: Payer: Self-pay | Admitting: Medical

## 2014-06-17 ENCOUNTER — Ambulatory Visit (INDEPENDENT_AMBULATORY_CARE_PROVIDER_SITE_OTHER): Payer: Managed Care, Other (non HMO) | Admitting: Medical

## 2014-06-17 VITALS — BP 120/80 | HR 59 | Temp 98.3°F | Ht 72.0 in | Wt 180.4 lb

## 2014-06-17 DIAGNOSIS — M5430 Sciatica, unspecified side: Secondary | ICD-10-CM | POA: Insufficient documentation

## 2014-06-17 DIAGNOSIS — M5432 Sciatica, left side: Secondary | ICD-10-CM

## 2014-06-17 NOTE — Assessment & Plan Note (Addendum)
Pt declines rx treatment. He is quite active and wants to see PT. Decided to send to Sports medicine.  MD will refer if he thinks  indicated.

## 2014-06-17 NOTE — Progress Notes (Signed)
Pre visit review using our clinic review tool, if applicable. No additional management support is needed unless otherwise documented below in the visit note. 

## 2014-06-17 NOTE — Progress Notes (Signed)
Subjective:    Patient ID: Mario Young, male    DOB: Oct 23, 1950, 64 y.o.   MRN: [id number]  HPI  Pt in states on Thursday morning got out of bed and felt some pain in left side of buttox /si area.(moderate to severe pain. Pt is active he walks a lot power walking and playing golf. Pt lost 53 pounds weight loss that was  Purposeful over last year. Pt is walking about 25 miles a week. 4-6 miles each walk.    When sits and puts pressure on area it doe hurt worse. When stands pain is decreased a lot  Pt only using advil. He declines any new tx. Pt leaving town June 30th.    Review of Systems  Constitutional: Negative for fever, chills and fatigue.  Respiratory: Negative for cough, chest tightness, shortness of breath and wheezing.   Cardiovascular: Negative for chest pain and palpitations.  Musculoskeletal: Positive for back pain.       Lt si area pain.  Neurological: Negative for dizziness, seizures, facial asymmetry, numbness and headaches.  Hematological: Negative for adenopathy. Does not bruise/bleed easily.  Psychiatric/Behavioral: Negative for confusion and agitation.   Past Medical History  Diagnosis Date  . Hyperlipidemia     no meds/in past  . H/O sinusitis 04/22/2014  . Preventative health care 04/28/2014  . Erectile dysfunction 04/28/2014    History   Social History  . Marital Status: Married    Spouse Name: N/A  . Number of Children: 1  . Years of Education: 16   Occupational History  . sales    Social History Main Topics  . Smoking status: Never Smoker   . Smokeless tobacco: Never Used  . Alcohol Use: 2.4 oz/week    4 Standard drinks or equivalent per week     Comment: socially, weekends only  . Drug Use: No  . Sexual Activity: Yes     Comment: lives with wife, ltravels for work in [employer], [employer] exercises   Other Topics Concern  . Not on file   Social History Narrative    Past Surgical History  Procedure Laterality Date  . No  past surgeries      Family History  Problem Relation Age of Onset  . Heart disease Paternal Uncle   . Hyperlipidemia Mother   . Heart disease Father     CHF  . Kidney disease Father   . Colon polyps Father     No Known Allergies  Current Outpatient Prescriptions on File Prior to Visit  Medication Sig Dispense Refill  . aspirin 81 MG tablet Take 81 mg by mouth daily.      . Multiple Vitamin (MULTIVITAMIN) tablet Take 1 tablet by mouth daily.      Marland Kitchen VIAGRA 50 MG tablet as needed.      No current facility-administered medications on file prior to visit.    BP 120/80 mmHg  Pulse 59  Temp(Src) 98.3 F (36.8 C) (Oral)  Ht 6' (1.829 m)  Wt 180 lb 6.4 oz (81.829 kg)  BMI 24.46 kg/m2  SpO2 97%       Objective:   Physical Exam  General- No acute distress. Pleasant patient. Neck- Full range of motion, no jvd Lungs- Clear, even and unlabored. Heart- regular rate and rhythm. Neurologic- CNII- XII grossly intact.  Back- no mid lumbar spine tenderness. Lt si tenderness to palpation. Mild pain on straight leg lift.  Lower ext neuro Normal l5-s1 sensation intact.  Assessment & Plan:

## 2014-06-17 NOTE — Patient Instructions (Addendum)
Sciatica Pt declines rx treatment. He is quite active and wants to see PT. Decided to send to Sports medicine.  MD will refer if he thinks  indicated.   Follow up in 7 days or as needed.

## 2014-06-19 ENCOUNTER — Encounter: Payer: Managed Care, Other (non HMO) | Admitting: Internal Medicine

## 2014-06-20 ENCOUNTER — Encounter (INDEPENDENT_AMBULATORY_CARE_PROVIDER_SITE_OTHER): Payer: Self-pay

## 2014-06-20 ENCOUNTER — Ambulatory Visit (INDEPENDENT_AMBULATORY_CARE_PROVIDER_SITE_OTHER): Payer: Managed Care, Other (non HMO) | Admitting: Family Medicine

## 2014-06-20 ENCOUNTER — Encounter: Payer: Self-pay | Admitting: Family Medicine

## 2014-06-20 VITALS — BP 149/81 | HR 56 | Ht 71.0 in | Wt 178.0 lb

## 2014-06-20 DIAGNOSIS — B029 Zoster without complications: Secondary | ICD-10-CM

## 2014-06-20 MED ORDER — VALACYCLOVIR HCL 1 G PO TABS: 1000.0000 mg | ORAL_TABLET | Freq: Three times a day (TID) | ORAL | Status: DC

## 2014-06-20 MED ORDER — PREDNISONE 10 MG PO TABS
ORAL_TABLET | ORAL | Status: DC
Start: 2014-06-20 — End: 2014-07-19

## 2014-06-20 MED ORDER — HYDROCODONE-ACETAMINOPHEN 5-325 MG PO TABS: 1.0000 | ORAL_TABLET | Freq: Four times a day (QID) | ORAL | Status: DC | PRN

## 2014-06-20 NOTE — Patient Instructions (Signed)
You have shingles. Take prednisone as directed for 6 days. Take valacyclovir three times a day for 7 days (take all of this even if you're feeling better). Do home exercises (hip side raises, standing hip rotations) 3 sets of 10 once a day. Hold stretches for 20-30 seconds, repeat 3 times (pick 2-3 of these). Follow up with me in just under 2 weeks to reevaluate the rash you have.

## 2014-06-24 DIAGNOSIS — Z8619 Personal history of other infectious and parasitic diseases: Secondary | ICD-10-CM | POA: Insufficient documentation

## 2014-06-24 NOTE — Progress Notes (Signed)
PCP: Penni Homans, MD Referred by Mackie Pai PA  Subjective:   HPI: Patient is a 64 y.o. male here for low back/leg pain.  Patient states for about a week he has had pain in left side of low back, buttock with radiation down the leg. Does a lot of traveling for work, driving. Also power walks 20-25 miles a week. No known injury, trauma, or increase in activity level though. Reports tingling down this leg to foot. Nothing into right leg. No bowel/bladder dysfunction. Does have a rash medial left foot.  Past Medical History  Diagnosis Date  . Hyperlipidemia     no meds/in past  . H/O sinusitis 04/22/2014  . Preventative health care 04/28/2014  . Erectile dysfunction 04/28/2014    Current Outpatient Prescriptions on File Prior to Visit  Medication Sig Dispense Refill  . aspirin 81 MG tablet Take 81 mg by mouth daily.      . Multiple Vitamin (MULTIVITAMIN) tablet Take 1 tablet by mouth daily.      Marland Kitchen VIAGRA 50 MG tablet as needed.      No current facility-administered medications on file prior to visit.    Past Surgical History  Procedure Laterality Date  . No past surgeries      No Known Allergies  History   Social History  . Marital Status: Married    Spouse Name: N/A  . Number of Children: 1  . Years of Education: 16   Occupational History  . sales    Social History Main Topics  . Smoking status: Never Smoker   . Smokeless tobacco: Never Used  . Alcohol Use: 2.4 oz/week    4 Standard drinks or equivalent per week     Comment: socially, weekends only  . Drug Use: No  . Sexual Activity: Yes     Comment: lives with wife, ltravels for work in [employer], [employer] exercises   Other Topics Concern  . Not on file   Social History Narrative    Family History  Problem Relation Age of Onset  . Heart disease Paternal Uncle   . Hyperlipidemia Mother   . Heart disease Father     CHF  . Kidney disease Father   . Colon polyps Father     BP  149/81 mmHg  Pulse 56  Ht 5\' 11"  (1.803 m)  Wt 178 lb (80.74 kg)  BMI 24.84 kg/m2  Review of Systems: See HPI above.    Objective:  Physical Exam:  Gen: NAD  Back: No gross deformity, scoliosis. TTP left piriformis area.  No midline or bony TTP.  No other tenderness. FROM back, hip without pain. Strength LEs 5/5 all muscle groups except 5-/5 with left hip abduction, mild pain.   2+ MSRs in patellar and achilles tendons, equal bilaterally. Negative SLRs. Sensation intact to light touch bilaterally. Negative logroll bilateral hips Mild pain left piriformis stretch.  Negative right piriformis.  Negative bilateral fabers. Cluster of blisters on a red base noted medial left foot.  He also has developing similar rash ant-lateral left thigh.    Assessment & Plan:  1. Shingles - L4 distribution though discussed he may have concurrent piriformis syndrome.  Concerned he may be too far out from start of symptoms to treat shingles with antivirals but will trial them as benefits > risks with medication - valtrex tid x 7 days.  Prednisone dose pack as well.  Shown home exercises, stretches for piriformis syndrome.  F/u in 2 weeks to reevaluate.

## 2014-06-24 NOTE — Assessment & Plan Note (Signed)
L4 distribution though discussed he may have concurrent piriformis syndrome.  Concerned he may be too far out from start of symptoms to treat shingles with antivirals but will trial them as benefits > risks with medication - valtrex tid x 7 days.  Prednisone dose pack as well.  Shown home exercises, stretches for piriformis syndrome.  F/u in 2 weeks to reevaluate.

## 2014-07-01 ENCOUNTER — Ambulatory Visit: Payer: Self-pay | Admitting: Family Medicine

## 2014-07-09 ENCOUNTER — Telehealth: Payer: Self-pay | Admitting: Family Medicine

## 2014-07-09 NOTE — Telephone Encounter (Signed)
pre visit letter mailed 07/09/14 °

## 2014-07-19 ENCOUNTER — Telehealth: Payer: Self-pay | Admitting: Behavioral Health

## 2014-07-19 ENCOUNTER — Encounter: Payer: Self-pay | Admitting: Behavioral Health

## 2014-07-19 NOTE — Telephone Encounter (Signed)
Pre-Visit Call completed with patient and chart updated.   Pre-Visit Info documented in Specialty Comments under [employer].    

## 2014-07-22 ENCOUNTER — Other Ambulatory Visit (INDEPENDENT_AMBULATORY_CARE_PROVIDER_SITE_OTHER): Payer: Managed Care, Other (non HMO)

## 2014-07-22 DIAGNOSIS — Z8709 Personal history of other diseases of the respiratory system: Secondary | ICD-10-CM

## 2014-07-22 DIAGNOSIS — Z86010 Personal history of colonic polyps: Secondary | ICD-10-CM | POA: Diagnosis not present

## 2014-07-22 DIAGNOSIS — Z0000 Encounter for general adult medical examination without abnormal findings: Secondary | ICD-10-CM

## 2014-07-22 LAB — CBC
HEMATOCRIT: 44.6 % (ref 39.0–52.0)
HEMOGLOBIN: 15.2 g/dL (ref 13.0–17.0)
MCHC: 34 g/dL (ref 30.0–36.0)
MCV: 90.4 fl (ref 78.0–100.0)
PLATELETS: 278 10*3/uL (ref 150.0–400.0)
RBC: 4.93 Mil/uL (ref 4.22–5.81)
RDW: 13.4 % (ref 11.5–15.5)
WBC: 4.9 10*3/uL (ref 4.0–10.5)

## 2014-07-22 LAB — COMPREHENSIVE METABOLIC PANEL
ALK PHOS: 96 U/L (ref 39–117)
ALT: 18 U/L (ref 0–53)
AST: 24 U/L (ref 0–37)
Albumin: 4.3 g/dL (ref 3.5–5.2)
BUN: 20 mg/dL (ref 6–23)
CHLORIDE: 106 meq/L (ref 96–112)
CO2: 28 mEq/L (ref 19–32)
Calcium: 9.4 mg/dL (ref 8.4–10.5)
Creatinine, Ser: 0.82 mg/dL (ref 0.40–1.50)
GFR: 100.48 mL/min (ref >60.00)
Glucose, Bld: 99 mg/dL (ref 70–99)
POTASSIUM: 4.8 meq/L (ref 3.5–5.1)
Sodium: 142 mEq/L (ref 135–145)
TOTAL PROTEIN: 7 g/dL (ref 6.0–8.3)
Total Bilirubin: 0.6 mg/dL (ref 0.2–1.2)

## 2014-07-22 LAB — LIPID PANEL
CHOL/HDL RATIO: 3
CHOLESTEROL: 191 mg/dL (ref 0–200)
HDL: 65.5 mg/dL (ref >39.00)
LDL Cholesterol: 108 mg/dL — ABNORMAL HIGH (ref 0–99)
NonHDL: 125.5
TRIGLYCERIDES: 89 mg/dL (ref 0.0–149.0)
VLDL: 17.8 mg/dL (ref 0.0–40.0)

## 2014-07-22 LAB — TSH: TSH: 1.65 u[IU]/mL (ref 0.35–4.50)

## 2014-07-26 ENCOUNTER — Encounter: Payer: Self-pay | Admitting: Behavioral Health

## 2014-07-26 ENCOUNTER — Telehealth: Payer: Self-pay | Admitting: Behavioral Health

## 2014-07-26 NOTE — Telephone Encounter (Signed)
Pre-Visit Call completed with patient and chart updated.   Pre-Visit Info documented in Specialty Comments under [employer].    Information is still the same from the call that was made on last week. No changes in information.

## 2014-07-29 ENCOUNTER — Encounter: Payer: Self-pay | Admitting: Family Medicine

## 2014-07-29 ENCOUNTER — Ambulatory Visit (INDEPENDENT_AMBULATORY_CARE_PROVIDER_SITE_OTHER): Payer: Managed Care, Other (non HMO) | Admitting: Family Medicine

## 2014-07-29 VITALS — BP 128/72 | HR 65 | Temp 98.2°F | Resp 16 | Ht 70.5 in | Wt 180.0 lb

## 2014-07-29 DIAGNOSIS — B029 Zoster without complications: Secondary | ICD-10-CM

## 2014-07-29 DIAGNOSIS — Z0000 Encounter for general adult medical examination without abnormal findings: Secondary | ICD-10-CM

## 2014-07-29 DIAGNOSIS — E785 Hyperlipidemia, unspecified: Secondary | ICD-10-CM

## 2014-07-29 NOTE — Patient Instructions (Signed)
Preventive Care for Adults A healthy lifestyle and preventive care can promote health and wellness. Preventive health guidelines for men include the following key practices:  A routine yearly physical is a good way to check with your health care provider about your health and preventative screening. It is a chance to share any concerns and updates on your health and to receive a thorough exam.  Visit your dentist for a routine exam and preventative care every 6 months. Brush your teeth twice a day and floss once a day. Good oral hygiene prevents tooth decay and gum disease.  The frequency of eye exams is based on your age, health, family medical history, use of contact lenses, and other factors. Follow your health care provider's recommendations for frequency of eye exams.  Eat a healthy diet. Foods such as vegetables, fruits, whole grains, low-fat dairy products, and lean protein foods contain the nutrients you need without too many calories. Decrease your intake of foods high in solid fats, added sugars, and salt. Eat the right amount of calories for you.Get information about a proper diet from your health care provider, if necessary.  Regular physical exercise is one of the most important things you can do for your health. Most adults should get at least 150 minutes of moderate-intensity exercise (any activity that increases your heart rate and causes you to sweat) each week. In addition, most adults need muscle-strengthening exercises on 2 or more days a week.  Maintain a healthy weight. The body mass index (BMI) is a screening tool to identify possible weight problems. It provides an estimate of body fat based on height and weight. Your health care provider can find your BMI and can help you achieve or maintain a healthy weight.For adults 20 years and older:  A BMI below 18.5 is considered underweight.  A BMI of 18.5 to 24.9 is normal.  A BMI of 25 to 29.9 is considered overweight.  A BMI  of 30 and above is considered obese.  Maintain normal blood lipids and cholesterol levels by exercising and minimizing your intake of saturated fat. Eat a balanced diet with plenty of fruit and vegetables. Blood tests for lipids and cholesterol should begin at age 50 and be repeated every 5 years. If your lipid or cholesterol levels are high, you are over 50, or you are at high risk for heart disease, you may need your cholesterol levels checked more frequently.Ongoing high lipid and cholesterol levels should be treated with medicines if diet and exercise are not working.  If you smoke, find out from your health care provider how to quit. If you do not use tobacco, do not start.  Lung cancer screening is recommended for adults aged 73-80 years who are at high risk for developing lung cancer because of a history of smoking. A yearly low-dose CT scan of the lungs is recommended for people who have at least a 30-pack-year history of smoking and are a current smoker or have quit within the past 15 years. A pack year of smoking is smoking an average of 1 pack of cigarettes a day for 1 year (for example: 1 pack a day for 30 years or 2 packs a day for 15 years). Yearly screening should continue until the smoker has stopped smoking for at least 15 years. Yearly screening should be stopped for people who develop a health problem that would prevent them from having lung cancer treatment.  If you choose to drink alcohol, do not have more than  2 drinks per day. One drink is considered to be 12 ounces (355 mL) of beer, 5 ounces (148 mL) of wine, or 1.5 ounces (44 mL) of liquor.  Avoid use of street drugs. Do not share needles with anyone. Ask for help if you need support or instructions about stopping the use of drugs.  High blood pressure causes heart disease and increases the risk of stroke. Your blood pressure should be checked at least every 1-2 years. Ongoing high blood pressure should be treated with  medicines, if weight loss and exercise are not effective.  If you are 45-79 years old, ask your health care provider if you should take aspirin to prevent heart disease.  Diabetes screening involves taking a blood sample to check your fasting blood sugar level. This should be done once every 3 years, after age 45, if you are within normal weight and without risk factors for diabetes. Testing should be considered at a younger age or be carried out more frequently if you are overweight and have at least 1 risk factor for diabetes.  Colorectal cancer can be detected and often prevented. Most routine colorectal cancer screening begins at the age of 50 and continues through age 75. However, your health care provider may recommend screening at an earlier age if you have risk factors for colon cancer. On a yearly basis, your health care provider may provide home test kits to check for hidden blood in the stool. Use of a small camera at the end of a tube to directly examine the colon (sigmoidoscopy or colonoscopy) can detect the earliest forms of colorectal cancer. Talk to your health care provider about this at age 50, when routine screening begins. Direct exam of the colon should be repeated every 5-10 years through age 75, unless early forms of precancerous polyps or small growths are found.  People who are at an increased risk for hepatitis B should be screened for this virus. You are considered at high risk for hepatitis B if:  You were born in a country where hepatitis B occurs often. Talk with your health care provider about which countries are considered high risk.  Your parents were born in a high-risk country and you have not received a shot to protect against hepatitis B (hepatitis B vaccine).  You have HIV or AIDS.  You use needles to inject street drugs.  You live with, or have sex with, someone who has hepatitis B.  You are a man who has sex with other men (MSM).  You get hemodialysis  treatment.  You take certain medicines for conditions such as cancer, organ transplantation, and autoimmune conditions.  Hepatitis C blood testing is recommended for all people born from 1945 through 1965 and any individual with known risks for hepatitis C.  Practice safe sex. Use condoms and avoid high-risk sexual practices to reduce the spread of sexually transmitted infections (STIs). STIs include gonorrhea, chlamydia, syphilis, trichomonas, herpes, HPV, and human immunodeficiency virus (HIV). Herpes, HIV, and HPV are viral illnesses that have no cure. They can result in disability, cancer, and death.  If you are at risk of being infected with HIV, it is recommended that you take a prescription medicine daily to prevent HIV infection. This is called preexposure prophylaxis (PrEP). You are considered at risk if:  You are a man who has sex with other men (MSM) and have other risk factors.  You are a heterosexual man, are sexually active, and are at increased risk for HIV infection.    You take drugs by injection.  You are sexually active with a partner who has HIV.  Talk with your health care provider about whether you are at high risk of being infected with HIV. If you choose to begin PrEP, you should first be tested for HIV. You should then be tested every 3 months for as long as you are taking PrEP.  A one-time screening for abdominal aortic aneurysm (AAA) and surgical repair of large AAAs by ultrasound are recommended for men ages 32 to 67 years who are current or former smokers.  Healthy men should no longer receive prostate-specific antigen (PSA) blood tests as part of routine cancer screening. Talk with your health care provider about prostate cancer screening.  Testicular cancer screening is not recommended for adult males who have no symptoms. Screening includes self-exam, a health care provider exam, and other screening tests. Consult with your health care provider about any symptoms  you have or any concerns you have about testicular cancer.  Use sunscreen. Apply sunscreen liberally and repeatedly throughout the day. You should seek shade when your shadow is shorter than you. Protect yourself by wearing long sleeves, pants, a wide-brimmed hat, and sunglasses year round, whenever you are outdoors.  Once a month, do a whole-body skin exam, using a mirror to look at the skin on your back. Tell your health care provider about new moles, moles that have irregular borders, moles that are larger than a pencil eraser, or moles that have changed in shape or color.  Stay current with required vaccines (immunizations).  Influenza vaccine. All adults should be immunized every year.  Tetanus, diphtheria, and acellular pertussis (Td, Tdap) vaccine. An adult who has not previously received Tdap or who does not know his vaccine status should receive 1 dose of Tdap. This initial dose should be followed by tetanus and diphtheria toxoids (Td) booster doses every 10 years. Adults with an unknown or incomplete history of completing a 3-dose immunization series with Td-containing vaccines should begin or complete a primary immunization series including a Tdap dose. Adults should receive a Td booster every 10 years.  Varicella vaccine. An adult without evidence of immunity to varicella should receive 2 doses or a second dose if he has previously received 1 dose.  Human papillomavirus (HPV) vaccine. Males aged 68-21 years who have not received the vaccine previously should receive the 3-dose series. Males aged 22-26 years may be immunized. Immunization is recommended through the age of 6 years for any male who has sex with males and did not get any or all doses earlier. Immunization is recommended for any person with an immunocompromised condition through the age of 49 years if he did not get any or all doses earlier. During the 3-dose series, the second dose should be obtained 4-8 weeks after the first  dose. The third dose should be obtained 24 weeks after the first dose and 16 weeks after the second dose.  Zoster vaccine. One dose is recommended for adults aged 50 years or older unless certain conditions are present.  Measles, mumps, and rubella (MMR) vaccine. Adults born before [birth year] generally are considered immune to measles and mumps. Adults born in [birth year] or later should have 1 or more doses of MMR vaccine unless there is a contraindication to the vaccine or there is laboratory evidence of immunity to each of the three diseases. A routine second dose of MMR vaccine should be obtained at least 28 days after the first dose for students attending postsecondary  schools, health care workers, or international travelers. People who received inactivated measles vaccine or an unknown type of measles vaccine during 1963-1967 should receive 2 doses of MMR vaccine. People who received inactivated mumps vaccine or an unknown type of mumps vaccine before 1979 and are at high risk for mumps infection should consider immunization with 2 doses of MMR vaccine. Unvaccinated health care workers born before 1957 who lack laboratory evidence of measles, mumps, or rubella immunity or laboratory confirmation of disease should consider measles and mumps immunization with 2 doses of MMR vaccine or rubella immunization with 1 dose of MMR vaccine.  Pneumococcal 13-valent conjugate (PCV13) vaccine. When indicated, a person who is uncertain of his immunization history and has no record of immunization should receive the PCV13 vaccine. An adult aged 19 years or older who has certain medical conditions and has not been previously immunized should receive 1 dose of PCV13 vaccine. This PCV13 should be followed with a dose of pneumococcal polysaccharide (PPSV23) vaccine. The PPSV23 vaccine dose should be obtained at least 8 weeks after the dose of PCV13 vaccine. An adult aged 19 years or older who has certain medical conditions and  previously received 1 or more doses of PPSV23 vaccine should receive 1 dose of PCV13. The PCV13 vaccine dose should be obtained 1 or more years after the last PPSV23 vaccine dose.  Pneumococcal polysaccharide (PPSV23) vaccine. When PCV13 is also indicated, PCV13 should be obtained first. All adults aged 65 years and older should be immunized. An adult younger than age 65 years who has certain medical conditions should be immunized. Any person who resides in a nursing home or long-term care facility should be immunized. An adult smoker should be immunized. People with an immunocompromised condition and certain other conditions should receive both PCV13 and PPSV23 vaccines. People with human immunodeficiency virus (HIV) infection should be immunized as soon as possible after diagnosis. Immunization during chemotherapy or radiation therapy should be avoided. Routine use of PPSV23 vaccine is not recommended for American Indians, [place] Natives, or people younger than 65 years unless there are medical conditions that require PPSV23 vaccine. When indicated, people who have unknown immunization and have no record of immunization should receive PPSV23 vaccine. One-time revaccination 5 years after the first dose of PPSV23 is recommended for people aged 19-64 years who have chronic kidney failure, nephrotic syndrome, asplenia, or immunocompromised conditions. People who received 1-2 doses of PPSV23 before age 65 years should receive another dose of PPSV23 vaccine at age 65 years or later if at least 5 years have passed since the previous dose. Doses of PPSV23 are not needed for people immunized with PPSV23 at or after age 65 years.  Meningococcal vaccine. Adults with asplenia or persistent complement component deficiencies should receive 2 doses of quadrivalent meningococcal conjugate (MenACWY-D) vaccine. The doses should be obtained at least 2 months apart. Microbiologists working with certain meningococcal bacteria,  military recruits, people at risk during an outbreak, and people who travel to or live in countries with a high rate of meningitis should be immunized. A first-year college student up through age 21 years who is living in a residence hall should receive a dose if he did not receive a dose on or after his 16th birthday. Adults who have certain high-risk conditions should receive one or more doses of vaccine.  Hepatitis A vaccine. Adults who wish to be protected from this disease, have certain high-risk conditions, work with hepatitis A-infected animals, work in hepatitis A research labs, or   travel to or work in countries with a high rate of hepatitis A should be immunized. Adults who were previously unvaccinated and who anticipate close contact with an international adoptee during the first 60 days after arrival in the [place] from a country with a high rate of hepatitis A should be immunized.  Hepatitis B vaccine. Adults should be immunized if they wish to be protected from this disease, have certain high-risk conditions, may be exposed to blood or other infectious body fluids, are household contacts or sex partners of hepatitis B positive people, are clients or workers in certain care facilities, or travel to or work in countries with a high rate of hepatitis B.  Haemophilus influenzae type b (Hib) vaccine. A previously unvaccinated person with asplenia or sickle cell disease or having a scheduled splenectomy should receive 1 dose of Hib vaccine. Regardless of previous immunization, a recipient of a hematopoietic stem cell transplant should receive a 3-dose series 6-12 months after his successful transplant. Hib vaccine is not recommended for adults with HIV infection. Preventive Service / Frequency Ages 52 to 17  Blood pressure check.** / Every 1 to 2 years.  Lipid and cholesterol check.** / Every 5 years beginning at age 69.  Hepatitis C blood test.** / For any individual with known risks for  hepatitis C.  Skin self-exam. / Monthly.  Influenza vaccine. / Every year.  Tetanus, diphtheria, and acellular pertussis (Tdap, Td) vaccine.** / Consult your health care provider. 1 dose of Td every 10 years.  Varicella vaccine.** / Consult your health care provider.  HPV vaccine. / 3 doses over 6 months, if 72 or younger.  Measles, mumps, rubella (MMR) vaccine.** / You need at least 1 dose of MMR if you were born in 1957 or later. You may also need a second dose.  Pneumococcal 13-valent conjugate (PCV13) vaccine.** / Consult your health care provider.  Pneumococcal polysaccharide (PPSV23) vaccine.** / 1 to 2 doses if you smoke cigarettes or if you have certain conditions.  Meningococcal vaccine.** / 1 dose if you are age 35 to 60 years and a Market researcher living in a residence hall, or have one of several medical conditions. You may also need additional booster doses.  Hepatitis A vaccine.** / Consult your health care provider.  Hepatitis B vaccine.** / Consult your health care provider.  Haemophilus influenzae type b (Hib) vaccine.** / Consult your health care provider. Ages 35 to 8  Blood pressure check.** / Every 1 to 2 years.  Lipid and cholesterol check.** / Every 5 years beginning at age 57.  Lung cancer screening. / Every year if you are aged 44-80 years and have a 30-pack-year history of smoking and currently smoke or have quit within the past 15 years. Yearly screening is stopped once you have quit smoking for at least 15 years or develop a health problem that would prevent you from having lung cancer treatment.  Fecal occult blood test (FOBT) of stool. / Every year beginning at age 55 and continuing until age 73. You may not have to do this test if you get a colonoscopy every 10 years.  Flexible sigmoidoscopy** or colonoscopy.** / Every 5 years for a flexible sigmoidoscopy or every 10 years for a colonoscopy beginning at age 28 and continuing until age  1.  Hepatitis C blood test.** / For all people born from [birth year] through 1965 and any individual with known risks for hepatitis C.  Skin self-exam. / Monthly.  Influenza vaccine. / Every  year.  Tetanus, diphtheria, and acellular pertussis (Tdap/Td) vaccine.** / Consult your health care provider. 1 dose of Td every 10 years.  Varicella vaccine.** / Consult your health care provider.  Zoster vaccine.** / 1 dose for adults aged 53 years or older.  Measles, mumps, rubella (MMR) vaccine.** / You need at least 1 dose of MMR if you were born in 1957 or later. You may also need a second dose.  Pneumococcal 13-valent conjugate (PCV13) vaccine.** / Consult your health care provider.  Pneumococcal polysaccharide (PPSV23) vaccine.** / 1 to 2 doses if you smoke cigarettes or if you have certain conditions.  Meningococcal vaccine.** / Consult your health care provider.  Hepatitis A vaccine.** / Consult your health care provider.  Hepatitis B vaccine.** / Consult your health care provider.  Haemophilus influenzae type b (Hib) vaccine.** / Consult your health care provider. Ages 77 and over  Blood pressure check.** / Every 1 to 2 years.  Lipid and cholesterol check.**/ Every 5 years beginning at age 85.  Lung cancer screening. / Every year if you are aged 55-80 years and have a 30-pack-year history of smoking and currently smoke or have quit within the past 15 years. Yearly screening is stopped once you have quit smoking for at least 15 years or develop a health problem that would prevent you from having lung cancer treatment.  Fecal occult blood test (FOBT) of stool. / Every year beginning at age 33 and continuing until age 11. You may not have to do this test if you get a colonoscopy every 10 years.  Flexible sigmoidoscopy** or colonoscopy.** / Every 5 years for a flexible sigmoidoscopy or every 10 years for a colonoscopy beginning at age 28 and continuing until age 73.  Hepatitis C blood  test.** / For all people born from [birth year] through 1965 and any individual with known risks for hepatitis C.  Abdominal aortic aneurysm (AAA) screening.** / A one-time screening for ages 50 to 27 years who are current or former smokers.  Skin self-exam. / Monthly.  Influenza vaccine. / Every year.  Tetanus, diphtheria, and acellular pertussis (Tdap/Td) vaccine.** / 1 dose of Td every 10 years.  Varicella vaccine.** / Consult your health care provider.  Zoster vaccine.** / 1 dose for adults aged 34 years or older.  Pneumococcal 13-valent conjugate (PCV13) vaccine.** / Consult your health care provider.  Pneumococcal polysaccharide (PPSV23) vaccine.** / 1 dose for all adults aged 63 years and older.  Meningococcal vaccine.** / Consult your health care provider.  Hepatitis A vaccine.** / Consult your health care provider.  Hepatitis B vaccine.** / Consult your health care provider.  Haemophilus influenzae type b (Hib) vaccine.** / Consult your health care provider. **Family history and personal history of risk and conditions may change your health care provider's recommendations. Document Released: 02/16/2001 Document Revised: 12/26/2012 Document Reviewed: 05/18/2010 [facility] Patient Information 2015 [place], [place]. This information is not intended to replace advice given to you by your health care provider. Make sure you discuss any questions you have with your health care provider.

## 2014-07-29 NOTE — Progress Notes (Signed)
Mario Young  [id number] [birth year] 07/29/2014      Progress Note-Follow Up  Subjective  Chief Complaint  Chief Complaint  Patient presents with  . Annual Exam    not fasting, already completed labs 7/18    HPI  Patient is a 64 y.o. male in today for routine medical care. Patient is in today for annual exam and doing very well. He continues to follow his heart healthy diet and regular exercise. No recent illness except for an operative shingles on his left foot. He was struggling with pain and initially was being treated for musculoskeletal concerns but then a rash developed on his left foot. Short course of antivirals was very helpful and he is feeling well today. Denies CP/palp/SOB/HA/congestion/fevers/GI or GU c/o. Taking meds as prescribed  Past Medical History  Diagnosis Date  . Hyperlipidemia     no meds/in past  . H/O sinusitis 04/22/2014  . Preventative health care 04/28/2014  . Erectile dysfunction 04/28/2014  . Shingles     Past Surgical History  Procedure Laterality Date  . No past surgeries      Family History  Problem Relation Age of Onset  . Heart disease Paternal Uncle   . Hyperlipidemia Mother   . Heart disease Father     CHF  . Kidney disease Father   . Colon polyps Father     History   Social History  . Marital Status: Married    Spouse Name: N/A  . Number of Children: 1  . Years of Education: 16   Occupational History  . sales    Social History Main Topics  . Smoking status: Never Smoker   . Smokeless tobacco: Never Used  . Alcohol Use: 2.4 oz/week    4 Standard drinks or equivalent per week     Comment: socially, weekends only  . Drug Use: No  . Sexual Activity: Yes     Comment: lives with wife, ltravels for work in [employer], mediterranean exercises   Other Topics Concern  . Not on file   Social History Narrative    Current Outpatient Prescriptions on File Prior to Visit  Medication Sig Dispense Refill  . aspirin  81 MG tablet Take 81 mg by mouth daily.      . Multiple Vitamin (MULTIVITAMIN) tablet Take 1 tablet by mouth daily.      Marland Kitchen VIAGRA 50 MG tablet as needed.      No current facility-administered medications on file prior to visit.    No Known Allergies  Review of Systems  Review of Systems  Constitutional: Negative for fever, chills and malaise/fatigue.  HENT: Negative for congestion, hearing loss and nosebleeds.   Eyes: Negative for discharge.  Respiratory: Negative for cough, sputum production, shortness of breath and wheezing.   Cardiovascular: Negative for chest pain, palpitations and leg swelling.  Gastrointestinal: Negative for heartburn, nausea, vomiting, abdominal pain, diarrhea, constipation and blood in stool.  Genitourinary: Negative for dysuria, urgency, frequency and hematuria.  Musculoskeletal: Negative for myalgias, back pain and falls.  Skin: Negative for rash.  Neurological: Negative for dizziness, tremors, sensory change, focal weakness, loss of consciousness, weakness and headaches.  Endo/Heme/Allergies: Negative for polydipsia. Does not bruise/bleed easily.  Psychiatric/Behavioral: Negative for depression and suicidal ideas. The patient is not nervous/anxious and does not have insomnia.     Objective  BP 128/72 mmHg  Pulse 65  Temp(Src) 98.2 F (36.8 C) (Oral)  Resp 16  Ht 5' 10.5" (1.791 m)  Wt  180 lb (81.647 kg)  BMI 25.45 kg/m2  SpO2 97%  Physical Exam  Physical Exam  Constitutional: He is oriented to person, place, and time and well-developed, well-nourished, and in no distress. No distress.  HENT:  Head: Normocephalic and atraumatic.  Eyes: Conjunctivae are normal.  Neck: Neck supple. No thyromegaly present.  Cardiovascular: Normal rate, regular rhythm and normal heart sounds.   No murmur heard. Pulmonary/Chest: Effort normal and breath sounds normal. No respiratory distress.  Abdominal: He exhibits no distension and no mass. There is no  tenderness.  Musculoskeletal: He exhibits no edema.  Neurological: He is alert and oriented to person, place, and time.  Skin: Skin is warm.  Psychiatric: Memory, affect and judgment normal.    Lab Results  Component Value Date   TSH 1.65 07/22/2014   Lab Results  Component Value Date   WBC 4.9 07/22/2014   HGB 15.2 07/22/2014   HCT 44.6 07/22/2014   MCV 90.4 07/22/2014   PLT 278.0 07/22/2014   Lab Results  Component Value Date   CREATININE 0.82 07/22/2014   BUN 20 07/22/2014   NA 142 07/22/2014   K 4.8 07/22/2014   CL 106 07/22/2014   CO2 28 07/22/2014   Lab Results  Component Value Date   ALT 18 07/22/2014   AST 24 07/22/2014   ALKPHOS 96 07/22/2014   BILITOT 0.6 07/22/2014   Lab Results  Component Value Date   CHOL 191 07/22/2014   Lab Results  Component Value Date   HDL 65.50 07/22/2014   Lab Results  Component Value Date   LDLCALC 108* 07/22/2014   Lab Results  Component Value Date   TRIG 89.0 07/22/2014   Lab Results  Component Value Date   CHOLHDL 3 07/22/2014     Assessment & Plan   Preventative health care Patient encouraged to maintain heart healthy diet, regular exercise, adequate sleep. Consider daily probiotics. Take medications as prescribed. Encouraged to bring [place] a copy of his living will and HCP for his chart and he agrees. He had a recetn course of shingles will allow for Zostavax in 6 months  Dyslipidemia Encouraged heart healthy diet, increase exercise, avoid trans fats, consider a krill oil cap daily. Doing very well  Shingles Left foot resolving nicely after antiviral treatment

## 2014-07-29 NOTE — Progress Notes (Signed)
Pre visit review using our clinic review tool, if applicable. No additional management support is needed unless otherwise documented below in the visit note. 

## 2014-07-29 NOTE — Assessment & Plan Note (Signed)
Left foot resolving nicely after antiviral treatment

## 2014-07-29 NOTE — Assessment & Plan Note (Signed)
Encouraged heart healthy diet, increase exercise, avoid trans fats, consider a krill oil cap daily. Doing very well

## 2014-07-29 NOTE — Assessment & Plan Note (Signed)
Patient encouraged to maintain heart healthy diet, regular exercise, adequate sleep. Consider daily probiotics. Take medications as prescribed. Encouraged to bring [place] a copy of his living will and HCP for his chart and he agrees. He had a recetn course of shingles will allow for Zostavax in 6 months

## 2014-08-19 ENCOUNTER — Encounter: Payer: Managed Care, Other (non HMO) | Admitting: Gastroenterology

## 2014-09-30 ENCOUNTER — Encounter: Payer: Managed Care, Other (non HMO) | Admitting: Gastroenterology

## 2014-10-29 ENCOUNTER — Encounter: Payer: Self-pay | Admitting: Family Medicine

## 2015-04-21 ENCOUNTER — Ambulatory Visit (INDEPENDENT_AMBULATORY_CARE_PROVIDER_SITE_OTHER): Payer: Managed Care, Other (non HMO) | Admitting: Physician Assistant

## 2015-04-21 ENCOUNTER — Encounter: Payer: Self-pay | Admitting: Physician Assistant

## 2015-04-21 VITALS — BP 120/68 | HR 55 | Temp 98.3°F | Ht 71.0 in | Wt 180.1 lb

## 2015-04-21 DIAGNOSIS — G5602 Carpal tunnel syndrome, left upper limb: Secondary | ICD-10-CM | POA: Diagnosis not present

## 2015-04-21 NOTE — Patient Instructions (Signed)
Wear wrist brace at night. You can wear during the day if possible over the next week. Take Aleve daily. Avoid repetitive twisting of the wrist and forearm. Call or return if symptoms are not improving as we will need to set you up with Neurology.

## 2015-04-21 NOTE — Progress Notes (Signed)
Pre visit review using our clinic review tool, if applicable. No additional management support is needed unless otherwise documented below in the visit note. 

## 2015-04-21 NOTE — Progress Notes (Signed)
   Patient presents to clinic today c/o numbness and tingling in first three fingers of left hand over the past few weeks. Patient does use computers often at work. Also golf's but has not for several weeks. Denies elbow pain. Sometimes has mild wrist pain.   Past Medical History  Diagnosis Date  . Hyperlipidemia     no meds/in past  . H/O sinusitis 04/22/2014  . Preventative health care 04/28/2014  . Erectile dysfunction 04/28/2014  . Shingles     Current Outpatient Prescriptions on File Prior to Visit  Medication Sig Dispense Refill  . aspirin 81 MG tablet Take 81 mg by mouth daily.      . Multiple Vitamin (MULTIVITAMIN) tablet Take 1 tablet by mouth daily.      Marland Kitchen VIAGRA 50 MG tablet as needed.      No current facility-administered medications on file prior to visit.    No Known Allergies  Family History  Problem Relation Age of Onset  . Heart disease Paternal Uncle   . Hyperlipidemia Mother   . Heart disease Father     CHF  . Kidney disease Father   . Colon polyps Father     Social History   Social History  . Marital Status: Married    Spouse Name: N/A  . Number of Children: 1  . Years of Education: 16   Occupational History  . sales    Social History Main Topics  . Smoking status: Never Smoker   . Smokeless tobacco: Never Used  . Alcohol Use: 2.4 oz/week    4 Standard drinks or equivalent per week     Comment: socially, weekends only  . Drug Use: No  . Sexual Activity: Yes     Comment: lives with wife, ltravels for work in [employer], [place] exercises   Other Topics Concern  . None   Social History Narrative    Review of Systems - See HPI.  All other ROS are negative.  BP 120/68 mmHg  Pulse 55  Temp(Src) 98.3 F (36.8 C) (Oral)  Ht 5\' 11"  (1.803 m)  Wt 180 lb 2 oz (81.704 kg)  BMI 25.13 kg/m2  SpO2 97%  Physical Exam  Constitutional: He is oriented to person, place, and time and well-developed, well-nourished, and in no distress.    HENT:  Head: Normocephalic and atraumatic.  Cardiovascular: Normal rate, regular rhythm, normal heart sounds and intact distal pulses.   Pulmonary/Chest: Effort normal and breath sounds normal. No respiratory distress. He has no wheezes. He has no rales. He exhibits no tenderness.  Musculoskeletal:       Left elbow: Normal.       Left wrist: Normal.       Left hand: Normal.  Neurological: He is alert and oriented to person, place, and time.  Questionable tinel sign. Some change in sensation between radial nerve distribution and distribution of ulnar and median nerves of L hand.   Skin: Skin is warm and dry. No rash noted.  Vitals reviewed.   No results found for this or any previous visit (from the past 2160 hour(s)).  Assessment/Plan: 1. Carpal tunnel syndrome of left wrist Wrist brace applied to wear as directed. Daily Aleve. Supportive measures reviewed. If not resolving will need assessment by Hand Surgery or Neuro

## 2015-04-22 ENCOUNTER — Other Ambulatory Visit: Payer: Self-pay | Admitting: Family Medicine

## 2015-04-22 NOTE — Telephone Encounter (Signed)
Opened in error

## 2015-07-28 ENCOUNTER — Other Ambulatory Visit (INDEPENDENT_AMBULATORY_CARE_PROVIDER_SITE_OTHER): Payer: Managed Care, Other (non HMO)

## 2015-07-28 ENCOUNTER — Telehealth: Payer: Self-pay | Admitting: *Deleted

## 2015-07-28 DIAGNOSIS — E785 Hyperlipidemia, unspecified: Secondary | ICD-10-CM | POA: Diagnosis not present

## 2015-07-28 DIAGNOSIS — Z0000 Encounter for general adult medical examination without abnormal findings: Secondary | ICD-10-CM | POA: Diagnosis not present

## 2015-07-28 NOTE — Telephone Encounter (Signed)
Called left message to call back 

## 2015-07-28 NOTE — Telephone Encounter (Signed)
Patient had a lab appointment with no lab orders. Do you know what labs the provider needs . Thank you.

## 2015-07-28 NOTE — Telephone Encounter (Signed)
If he has gone on Medicare he needs to do labs day of visit to insure payment from Medicare because he does not have enough payable diagnoses. If he is still private insurance can order cmp, cbc, tsh and lipid for preventative and dyslipidemia.

## 2015-07-28 NOTE — Telephone Encounter (Signed)
Will forward to Dr. Charlett Blake

## 2015-07-28 NOTE — Telephone Encounter (Signed)
Patient informed he has [employer], does not have medicare. Lab orders entered.

## 2015-07-29 LAB — LIPID PANEL
CHOLESTEROL: 160 mg/dL (ref 0–200)
HDL: 49.7 mg/dL (ref >39.00)
LDL Cholesterol: 86 mg/dL (ref 0–99)
NonHDL: 110.18
Total CHOL/HDL Ratio: 3
Triglycerides: 119 mg/dL (ref 0.0–149.0)
VLDL: 23.8 mg/dL (ref 0.0–40.0)

## 2015-07-29 LAB — COMPREHENSIVE METABOLIC PANEL
ALBUMIN: 4.1 g/dL (ref 3.5–5.2)
ALT: 20 U/L (ref 0–53)
AST: 25 U/L (ref 0–37)
Alkaline Phosphatase: 82 U/L (ref 39–117)
BILIRUBIN TOTAL: 0.5 mg/dL (ref 0.2–1.2)
BUN: 13 mg/dL (ref 6–23)
CALCIUM: 9.2 mg/dL (ref 8.4–10.5)
CO2: 27 mEq/L (ref 19–32)
CREATININE: 0.82 mg/dL (ref 0.40–1.50)
Chloride: 104 mEq/L (ref 96–112)
GFR: 100.16 mL/min (ref >60.00)
Glucose, Bld: 68 mg/dL — ABNORMAL LOW (ref 70–99)
Potassium: 4.1 mEq/L (ref 3.5–5.1)
Sodium: 140 mEq/L (ref 135–145)
TOTAL PROTEIN: 7 g/dL (ref 6.0–8.3)

## 2015-07-29 LAB — CBC
HCT: 42.8 % (ref 39.0–52.0)
Hemoglobin: 14.1 g/dL (ref 13.0–17.0)
MCHC: 32.9 g/dL (ref 30.0–36.0)
MCV: 90.4 fl (ref 78.0–100.0)
Platelets: 336 10*3/uL (ref 150.0–400.0)
RBC: 4.74 Mil/uL (ref 4.22–5.81)
RDW: 13 % (ref 11.5–15.5)
WBC: 7.1 10*3/uL (ref 4.0–10.5)

## 2015-07-29 LAB — TSH: TSH: 3.69 u[IU]/mL (ref 0.35–4.50)

## 2015-07-30 ENCOUNTER — Encounter: Payer: Self-pay | Admitting: Family

## 2015-08-04 ENCOUNTER — Encounter: Payer: Self-pay | Admitting: Family Medicine

## 2015-08-04 ENCOUNTER — Ambulatory Visit (INDEPENDENT_AMBULATORY_CARE_PROVIDER_SITE_OTHER): Payer: Managed Care, Other (non HMO) | Admitting: Family Medicine

## 2015-08-04 VITALS — BP 112/78 | HR 59 | Temp 98.7°F | Ht 71.0 in | Wt 176.4 lb

## 2015-08-04 DIAGNOSIS — R05 Cough: Secondary | ICD-10-CM | POA: Diagnosis not present

## 2015-08-04 DIAGNOSIS — Z0000 Encounter for general adult medical examination without abnormal findings: Secondary | ICD-10-CM

## 2015-08-04 DIAGNOSIS — E785 Hyperlipidemia, unspecified: Secondary | ICD-10-CM

## 2015-08-04 DIAGNOSIS — Z1211 Encounter for screening for malignant neoplasm of colon: Secondary | ICD-10-CM

## 2015-08-04 DIAGNOSIS — Z0001 Encounter for general adult medical examination with abnormal findings: Secondary | ICD-10-CM | POA: Diagnosis not present

## 2015-08-04 DIAGNOSIS — R059 Cough, unspecified: Secondary | ICD-10-CM

## 2015-08-04 MED ORDER — AZITHROMYCIN 250 MG PO TABS: ORAL_TABLET | ORAL | 1 refills | Status: DC

## 2015-08-04 NOTE — Progress Notes (Signed)
Pre visit review using our clinic review tool, if applicable. No additional management support is needed unless otherwise documented below in the visit note. 

## 2015-08-04 NOTE — Patient Instructions (Addendum)
[place] Lidocaine patches to wrist after applying before applying splint [place] 10 strain probiotic 1 cap daily at [facility] or [email or web address]  Elderberry liquid and caps Zinc and Vitamin C, consider aged or black garlic Plain mucinex 2 x day  Preventive Care for Adults, Male A healthy lifestyle and preventive care can promote health and wellness. Preventive health guidelines for men include the following key practices:  A routine yearly physical is a good way to check with your health care provider about your health and preventative screening. It is a chance to share any concerns and updates on your health and to receive a thorough exam.  Visit your dentist for a routine exam and preventative care every 6 months. Brush your teeth twice a day and floss once a day. Good oral hygiene prevents tooth decay and gum disease.  The frequency of eye exams is based on your age, health, family medical history, use of contact lenses, and other factors. Follow your health care provider's recommendations for frequency of eye exams.  Eat a healthy diet. Foods such as vegetables, fruits, whole grains, low-fat dairy products, and lean protein foods contain the nutrients you need without too many calories. Decrease your intake of foods high in solid fats, added sugars, and salt. Eat the right amount of calories for you.Get information about a proper diet from your health care provider, if necessary.  Regular physical exercise is one of the most important things you can do for your health. Most adults should get at least 150 minutes of moderate-intensity exercise (any activity that increases your heart rate and causes you to sweat) each week. In addition, most adults need muscle-strengthening exercises on 2 or more days a week.  Maintain a healthy weight. The body mass index (BMI) is a screening tool to identify possible weight problems. It provides an estimate of body fat based on height and weight. Your  health care provider can find your BMI and can help you achieve or maintain a healthy weight.For adults 20 years and older:  A BMI below 18.5 is considered underweight.  A BMI of 18.5 to 24.9 is normal.  A BMI of 25 to 29.9 is considered overweight.  A BMI of 30 and above is considered obese.  Maintain normal blood lipids and cholesterol levels by exercising and minimizing your intake of saturated fat. Eat a balanced diet with plenty of fruit and vegetables. Blood tests for lipids and cholesterol should begin at age 75 and be repeated every 5 years. If your lipid or cholesterol levels are high, you are over 50, or you are at high risk for heart disease, you may need your cholesterol levels checked more frequently.Ongoing high lipid and cholesterol levels should be treated with medicines if diet and exercise are not working.  If you smoke, find out from your health care provider how to quit. If you do not use tobacco, do not start.  Lung cancer screening is recommended for adults aged 47-80 years who are at high risk for developing lung cancer because of a history of smoking. A yearly low-dose CT scan of the lungs is recommended for people who have at least a 30-pack-year history of smoking and are a current smoker or have quit within the past 15 years. A pack year of smoking is smoking an average of 1 pack of cigarettes a day for 1 year (for example: 1 pack a day for 30 years or 2 packs a day for 15 years). Yearly screening should continue  until the smoker has stopped smoking for at least 15 years. Yearly screening should be stopped for people who develop a health problem that would prevent them from having lung cancer treatment.  If you choose to drink alcohol, do not have more than 2 drinks per day. One drink is considered to be 12 ounces (355 mL) of beer, 5 ounces (148 mL) of wine, or 1.5 ounces (44 mL) of liquor.  Avoid use of street drugs. Do not share needles with anyone. Ask for help if  you need support or instructions about stopping the use of drugs.  High blood pressure causes heart disease and increases the risk of stroke. Your blood pressure should be checked at least every 1-2 years. Ongoing high blood pressure should be treated with medicines, if weight loss and exercise are not effective.  If you are 48-25 years old, ask your health care provider if you should take aspirin to prevent heart disease.  Diabetes screening is done by taking a blood sample to check your blood glucose level after you have not eaten for a certain period of time (fasting). If you are not overweight and you do not have risk factors for diabetes, you should be screened once every 3 years starting at age 26. If you are overweight or obese and you are 67-63 years of age, you should be screened for diabetes every year as part of your cardiovascular risk assessment.  Colorectal cancer can be detected and often prevented. Most routine colorectal cancer screening begins at the age of 50 and continues through age 20. However, your health care provider may recommend screening at an earlier age if you have risk factors for colon cancer. On a yearly basis, your health care provider may provide home test kits to check for hidden blood in the stool. Use of a small camera at the end of a tube to directly examine the colon (sigmoidoscopy or colonoscopy) can detect the earliest forms of colorectal cancer. Talk to your health care provider about this at age 62, when routine screening begins. Direct exam of the colon should be repeated every 5-10 years through age 23, unless early forms of precancerous polyps or small growths are found.  People who are at an increased risk for hepatitis B should be screened for this virus. You are considered at high risk for hepatitis B if:  You were born in a country where hepatitis B occurs often. Talk with your health care provider about which countries are considered high risk.  Your  parents were born in a high-risk country and you have not received a shot to protect against hepatitis B (hepatitis B vaccine).  You have HIV or AIDS.  You use needles to inject street drugs.  You live with, or have sex with, someone who has hepatitis B.  You are a man who has sex with other men (MSM).  You get hemodialysis treatment.  You take certain medicines for conditions such as cancer, organ transplantation, and autoimmune conditions.  Hepatitis C blood testing is recommended for all people born from [birth year] through 1965 and any individual with known risks for hepatitis C.  Practice safe sex. Use condoms and avoid high-risk sexual practices to reduce the spread of sexually transmitted infections (STIs). STIs include gonorrhea, chlamydia, syphilis, trichomonas, herpes, HPV, and human immunodeficiency virus (HIV). Herpes, HIV, and HPV are viral illnesses that have no cure. They can result in disability, cancer, and death.  If you are a man who has sex with  other men, you should be screened at least once per year for:  HIV.  Urethral, rectal, and pharyngeal infection of gonorrhea, chlamydia, or both.  If you are at risk of being infected with HIV, it is recommended that you take a prescription medicine daily to prevent HIV infection. This is called preexposure prophylaxis (PrEP). You are considered at risk if:  You are a man who has sex with other men (MSM) and have other risk factors.  You are a heterosexual man, are sexually active, and are at increased risk for HIV infection.  You take drugs by injection.  You are sexually active with a partner who has HIV.  Talk with your health care provider about whether you are at high risk of being infected with HIV. If you choose to begin PrEP, you should first be tested for HIV. You should then be tested every 3 months for as long as you are taking PrEP.  A one-time screening for abdominal aortic aneurysm (AAA) and surgical repair of  large AAAs by ultrasound are recommended for men ages 66 to 39 years who are current or former smokers.  Healthy men should no longer receive prostate-specific antigen (PSA) blood tests as part of routine cancer screening. Talk with your health care provider about prostate cancer screening.  Testicular cancer screening is not recommended for adult males who have no symptoms. Screening includes self-exam, a health care provider exam, and other screening tests. Consult with your health care provider about any symptoms you have or any concerns you have about testicular cancer.  Use sunscreen. Apply sunscreen liberally and repeatedly throughout the day. You should seek shade when your shadow is shorter than you. Protect yourself by wearing long sleeves, pants, a wide-brimmed hat, and sunglasses year round, whenever you are outdoors.  Once a month, do a whole-body skin exam, using a mirror to look at the skin on your back. Tell your health care provider about new moles, moles that have irregular borders, moles that are larger than a pencil eraser, or moles that have changed in shape or color.  Stay current with required vaccines (immunizations).  Influenza vaccine. All adults should be immunized every year.  Tetanus, diphtheria, and acellular pertussis (Td, Tdap) vaccine. An adult who has not previously received Tdap or who does not know his vaccine status should receive 1 dose of Tdap. This initial dose should be followed by tetanus and diphtheria toxoids (Td) booster doses every 10 years. Adults with an unknown or incomplete history of completing a 3-dose immunization series with Td-containing vaccines should begin or complete a primary immunization series including a Tdap dose. Adults should receive a Td booster every 10 years.  Varicella vaccine. An adult without evidence of immunity to varicella should receive 2 doses or a second dose if he has previously received 1 dose.  Human papillomavirus  (HPV) vaccine. Males aged 11-21 years who have not received the vaccine previously should receive the 3-dose series. Males aged 22-26 years may be immunized. Immunization is recommended through the age of 55 years for any male who has sex with males and did not get any or all doses earlier. Immunization is recommended for any person with an immunocompromised condition through the age of 8 years if he did not get any or all doses earlier. During the 3-dose series, the second dose should be obtained 4-8 weeks after the first dose. The third dose should be obtained 24 weeks after the first dose and 16 weeks after the second  dose.  Zoster vaccine. One dose is recommended for adults aged 73 years or older unless certain conditions are present.  Measles, mumps, and rubella (MMR) vaccine. Adults born before [birth year] generally are considered immune to measles and mumps. Adults born in [birth year] or later should have 1 or more doses of MMR vaccine unless there is a contraindication to the vaccine or there is laboratory evidence of immunity to each of the three diseases. A routine second dose of MMR vaccine should be obtained at least 28 days after the first dose for students attending postsecondary schools, health care workers, or international travelers. People who received inactivated measles vaccine or an unknown type of measles vaccine during 1963-1967 should receive 2 doses of MMR vaccine. People who received inactivated mumps vaccine or an unknown type of mumps vaccine before 1979 and are at high risk for mumps infection should consider immunization with 2 doses of MMR vaccine. Unvaccinated health care workers born before [birth year] who lack laboratory evidence of measles, mumps, or rubella immunity or laboratory confirmation of disease should consider measles and mumps immunization with 2 doses of MMR vaccine or rubella immunization with 1 dose of MMR vaccine.  Pneumococcal 13-valent conjugate (PCV13) vaccine. When indicated,  a person who is uncertain of his immunization history and has no record of immunization should receive the PCV13 vaccine. All adults 6 years of age and older should receive this vaccine. An adult aged 74 years or older who has certain medical conditions and has not been previously immunized should receive 1 dose of PCV13 vaccine. This PCV13 should be followed with a dose of pneumococcal polysaccharide (PPSV23) vaccine. Adults who are at high risk for pneumococcal disease should obtain the PPSV23 vaccine at least 8 weeks after the dose of PCV13 vaccine. Adults older than 65 years of age who have normal immune system function should obtain the PPSV23 vaccine dose at least 1 year after the dose of PCV13 vaccine.  Pneumococcal polysaccharide (PPSV23) vaccine. When PCV13 is also indicated, PCV13 should be obtained first. All adults aged 84 years and older should be immunized. An adult younger than age 58 years who has certain medical conditions should be immunized. Any person who resides in a nursing home or long-term care facility should be immunized. An adult smoker should be immunized. People with an immunocompromised condition and certain other conditions should receive both PCV13 and PPSV23 vaccines. People with human immunodeficiency virus (HIV) infection should be immunized as soon as possible after diagnosis. Immunization during chemotherapy or radiation therapy should be avoided. Routine use of PPSV23 vaccine is not recommended for American Indians, [place] Natives, or people younger than 65 years unless there are medical conditions that require PPSV23 vaccine. When indicated, people who have unknown immunization and have no record of immunization should receive PPSV23 vaccine. One-time revaccination 5 years after the first dose of PPSV23 is recommended for people aged 19-64 years who have chronic kidney failure, nephrotic syndrome, asplenia, or immunocompromised conditions. People who received 1-2 doses of  PPSV23 before age 67 years should receive another dose of PPSV23 vaccine at age 47 years or later if at least 5 years have passed since the previous dose. Doses of PPSV23 are not needed for people immunized with PPSV23 at or after age 38 years.  Meningococcal vaccine. Adults with asplenia or persistent complement component deficiencies should receive 2 doses of quadrivalent meningococcal conjugate (MenACWY-D) vaccine. The doses should be obtained at least 2 months apart. Microbiologists working with certain meningococcal bacteria, [employer] recruits,  people at risk during an outbreak, and people who travel to or live in countries with a high rate of meningitis should be immunized. A first-year college student up through age 9 years who is living in a residence hall should receive a dose if he did not receive a dose on or after his 16th birthday. Adults who have certain high-risk conditions should receive one or more doses of vaccine.  Hepatitis A vaccine. Adults who wish to be protected from this disease, have chronic liver disease, work with hepatitis A-infected animals, work in hepatitis A research labs, or travel to or work in countries with a high rate of hepatitis A should be immunized. Adults who were previously unvaccinated and who anticipate close contact with an international adoptee during the first 60 days after arrival in the [place] from a country with a high rate of hepatitis A should be immunized.  Hepatitis B vaccine. Adults should be immunized if they wish to be protected from this disease, are under age 27 years and have diabetes, have chronic liver disease, have had more than one sex partner in the past 6 months, may be exposed to blood or other infectious body fluids, are household contacts or sex partners of hepatitis B positive people, are clients or workers in certain care facilities, or travel to or work in countries with a high rate of hepatitis B.  Haemophilus influenzae type  b (Hib) vaccine. A previously unvaccinated person with asplenia or sickle cell disease or having a scheduled splenectomy should receive 1 dose of Hib vaccine. Regardless of previous immunization, a recipient of a hematopoietic stem cell transplant should receive a 3-dose series 6-12 months after his successful transplant. Hib vaccine is not recommended for adults with HIV infection. Preventive Service / Frequency Ages 49 to 107  Blood pressure check.** / Every 3-5 years.  Lipid and cholesterol check.** / Every 5 years beginning at age 4.  Hepatitis C blood test.** / For any individual with known risks for hepatitis C.  Skin self-exam. / Monthly.  Influenza vaccine. / Every year.  Tetanus, diphtheria, and acellular pertussis (Tdap, Td) vaccine.** / Consult your health care provider. 1 dose of Td every 10 years.  Varicella vaccine.** / Consult your health care provider.  HPV vaccine. / 3 doses over 6 months, if 21 or younger.  Measles, mumps, rubella (MMR) vaccine.** / You need at least 1 dose of MMR if you were born in 1957 or later. You may also need a second dose.  Pneumococcal 13-valent conjugate (PCV13) vaccine.** / Consult your health care provider.  Pneumococcal polysaccharide (PPSV23) vaccine.** / 1 to 2 doses if you smoke cigarettes or if you have certain conditions.  Meningococcal vaccine.** / 1 dose if you are age 72 to 8 years and a Market researcher living in a residence hall, or have one of several medical conditions. You may also need additional booster doses.  Hepatitis A vaccine.** / Consult your health care provider.  Hepatitis B vaccine.** / Consult your health care provider.  Haemophilus influenzae type b (Hib) vaccine.** / Consult your health care provider. Ages 17 to 60  Blood pressure check.** / Every year.  Lipid and cholesterol check.** / Every 5 years beginning at age 31.  Lung cancer screening. / Every year if you are aged 41-80 years and have  a 30-pack-year history of smoking and currently smoke or have quit within the past 15 years. Yearly screening is stopped once you have quit smoking for at  least 15 years or develop a health problem that would prevent you from having lung cancer treatment.  Fecal occult blood test (FOBT) of stool. / Every year beginning at age 23 and continuing until age 68. You may not have to do this test if you get a colonoscopy every 10 years.  Flexible sigmoidoscopy** or colonoscopy.** / Every 5 years for a flexible sigmoidoscopy or every 10 years for a colonoscopy beginning at age 62 and continuing until age 66.  Hepatitis C blood test.** / For all people born from [birth year] through 1965 and any individual with known risks for hepatitis C.  Skin self-exam. / Monthly.  Influenza vaccine. / Every year.  Tetanus, diphtheria, and acellular pertussis (Tdap/Td) vaccine.** / Consult your health care provider. 1 dose of Td every 10 years.  Varicella vaccine.** / Consult your health care provider.  Zoster vaccine.** / 1 dose for adults aged 12 years or older.  Measles, mumps, rubella (MMR) vaccine.** / You need at least 1 dose of MMR if you were born in 1957 or later. You may also need a second dose.  Pneumococcal 13-valent conjugate (PCV13) vaccine.** / Consult your health care provider.  Pneumococcal polysaccharide (PPSV23) vaccine.** / 1 to 2 doses if you smoke cigarettes or if you have certain conditions.  Meningococcal vaccine.** / Consult your health care provider.  Hepatitis A vaccine.** / Consult your health care provider.  Hepatitis B vaccine.** / Consult your health care provider.  Haemophilus influenzae type b (Hib) vaccine.** / Consult your health care provider. Ages 36 and over  Blood pressure check.** / Every year.  Lipid and cholesterol check.**/ Every 5 years beginning at age 37.  Lung cancer screening. / Every year if you are aged 47-80 years and have a 30-pack-year history of smoking and  currently smoke or have quit within the past 15 years. Yearly screening is stopped once you have quit smoking for at least 15 years or develop a health problem that would prevent you from having lung cancer treatment.  Fecal occult blood test (FOBT) of stool. / Every year beginning at age 34 and continuing until age 34. You may not have to do this test if you get a colonoscopy every 10 years.  Flexible sigmoidoscopy** or colonoscopy.** / Every 5 years for a flexible sigmoidoscopy or every 10 years for a colonoscopy beginning at age 21 and continuing until age 23.  Hepatitis C blood test.** / For all people born from [birth year] through 1965 and any individual with known risks for hepatitis C.  Abdominal aortic aneurysm (AAA) screening.** / A one-time screening for ages 39 to 50 years who are current or former smokers.  Skin self-exam. / Monthly.  Influenza vaccine. / Every year.  Tetanus, diphtheria, and acellular pertussis (Tdap/Td) vaccine.** / 1 dose of Td every 10 years.  Varicella vaccine.** / Consult your health care provider.  Zoster vaccine.** / 1 dose for adults aged 86 years or older.  Pneumococcal 13-valent conjugate (PCV13) vaccine.** / 1 dose for all adults aged 9 years and older.  Pneumococcal polysaccharide (PPSV23) vaccine.** / 1 dose for all adults aged 77 years and older.  Meningococcal vaccine.** / Consult your health care provider.  Hepatitis A vaccine.** / Consult your health care provider.  Hepatitis B vaccine.** / Consult your health care provider.  Haemophilus influenzae type b (Hib) vaccine.** / Consult your health care provider. **Family history and personal history of risk and conditions may change your health care provider's recommendations.   This information  is not intended to replace advice given to you by your health care provider. Make sure you discuss any questions you have with your health care provider.   Document Released: 02/16/2001 Document Revised:  01/11/2014 Document Reviewed: 05/18/2010 Elsevier Interactive Patient Education [employer].

## 2015-08-05 ENCOUNTER — Encounter: Payer: Self-pay | Admitting: Gastroenterology

## 2015-08-13 NOTE — Progress Notes (Signed)
Patient ID: Mario Young, male   DOB: 01-10-1950, 65 y.o.   MRN: [id number]   Subjective:    Patient ID: Mario Young, male    DOB: [birth year], 65 y.o.   MRN: [id number]  Chief Complaint  Patient presents with  . Annual Exam    HPI Patient is in today for annual exam and follow up on numerouse medical concerns. Is due for next colonoscopy. No bloody or tarry stool. No recent illness. Has had some trouble with tingling in left hand although treatment with a soft cast has been helpful. Denies CP/palp/SOB/HA/congestion/fevers/GI or GU c/o. Taking meds as prescribed  Past Medical History:  Diagnosis Date  . Erectile dysfunction 04/28/2014  . H/O sinusitis 04/22/2014  . Hyperlipidemia    no meds/in past  . Preventative health care 04/28/2014  . Shingles     Past Surgical History:  Procedure Laterality Date  . NO PAST SURGERIES      Family History  Problem Relation Age of Onset  . Hyperlipidemia Mother   . Heart disease Father     CHF  . Kidney disease Father   . Colon polyps Father   . Heart disease Paternal Uncle     Social History   Social History  . Marital status: Married    Spouse name: N/A  . Number of children: 1  . Years of education: 83   Occupational History  . sales    Social History Main Topics  . Smoking status: Never Smoker  . Smokeless tobacco: Never Used  . Alcohol use 2.4 oz/week    4 Standard drinks or equivalent per week     Comment: socially, weekends only  . Drug use: No  . Sexual activity: Yes     Comment: lives with wife, ltravels for work in [employer], [place]   Other Topics Concern  . Not on file   Social History Narrative  . No narrative on file    Outpatient Medications Prior to Visit  Medication Sig Dispense Refill  . aspirin 81 MG tablet Take 81 mg by mouth daily.      . Multiple Vitamin (MULTIVITAMIN) tablet Take 1 tablet by mouth daily.      Marland Kitchen VIAGRA 50 MG tablet as needed.      No  facility-administered medications prior to visit.     No Known Allergies  Review of Systems  Constitutional: Negative for chills, fever and malaise/fatigue.  HENT: Positive for congestion. Negative for hearing loss.   Eyes: Negative for discharge.  Respiratory: Positive for cough. Negative for sputum production and shortness of breath.   Cardiovascular: Negative for chest pain, palpitations and leg swelling.  Gastrointestinal: Negative for abdominal pain, blood in stool, constipation, diarrhea, heartburn, nausea and vomiting.  Genitourinary: Negative for dysuria, frequency, hematuria and urgency.  Musculoskeletal: Negative for back pain, falls and myalgias.  Skin: Negative for rash.  Neurological: Negative for dizziness, sensory change, loss of consciousness, weakness and headaches.  Endo/Heme/Allergies: Negative for environmental allergies. Does not bruise/bleed easily.  Psychiatric/Behavioral: Negative for depression and suicidal ideas. The patient is not nervous/anxious and does not have insomnia.        Objective:    Physical Exam  Constitutional: He is oriented to person, place, and time. He appears well-developed and well-nourished. No distress.  HENT:  Head: Normocephalic and atraumatic.  Eyes: Conjunctivae are normal.  Neck: Neck supple. No thyromegaly present.  Cardiovascular: Normal rate, regular rhythm and normal heart sounds.   No murmur  heard. Pulmonary/Chest: Effort normal and breath sounds normal. No respiratory distress. He has no wheezes.  Abdominal: Soft. Bowel sounds are normal. He exhibits no mass. There is no tenderness.  Musculoskeletal: He exhibits no edema.  Lymphadenopathy:    He has no cervical adenopathy.  Neurological: He is alert and oriented to person, place, and time.  Skin: Skin is warm and dry.  Psychiatric: He has a normal mood and affect. His behavior is normal.    BP 112/78 (BP Location: Left Arm, Patient Position: Sitting, Cuff Size:  Normal)   Pulse (!) 59   Temp 98.7 F (37.1 C) (Oral)   Ht 5\' 11"  (1.803 m)   Wt 176 lb 6 oz (80 kg)   BMI 24.60 kg/m  Wt Readings from Last 3 Encounters:  08/04/15 176 lb 6 oz (80 kg)  07/29/14 180 lb (81.6 kg)  06/20/14 178 lb (80.7 kg)     Lab Results  Component Value Date   WBC 7.1 07/28/2015   HGB 14.1 07/28/2015   HCT 42.8 07/28/2015   PLT 336.0 07/28/2015   GLUCOSE 68 (L) 07/28/2015   CHOL 160 07/28/2015   TRIG 119.0 07/28/2015   HDL 49.70 07/28/2015   LDLDIRECT 145.6 12/04/2010   LDLCALC 86 07/28/2015   ALT 20 07/28/2015   AST 25 07/28/2015   NA 140 07/28/2015   K 4.1 07/28/2015   CL 104 07/28/2015   CREATININE 0.82 07/28/2015   BUN 13 07/28/2015   CO2 27 07/28/2015   TSH 3.69 07/28/2015   PSA 1.20 12/07/2010    Lab Results  Component Value Date   TSH 3.69 07/28/2015   Lab Results  Component Value Date   WBC 7.1 07/28/2015   HGB 14.1 07/28/2015   HCT 42.8 07/28/2015   MCV 90.4 07/28/2015   PLT 336.0 07/28/2015   Lab Results  Component Value Date   NA 140 07/28/2015   K 4.1 07/28/2015   CO2 27 07/28/2015   GLUCOSE 68 (L) 07/28/2015   BUN 13 07/28/2015   CREATININE 0.82 07/28/2015   BILITOT 0.5 07/28/2015   ALKPHOS 82 07/28/2015   AST 25 07/28/2015   ALT 20 07/28/2015   PROT 7.0 07/28/2015   ALBUMIN 4.1 07/28/2015   CALCIUM 9.2 07/28/2015   GFR 100.16 07/28/2015   Lab Results  Component Value Date   CHOL 160 07/28/2015   Lab Results  Component Value Date   HDL 49.70 07/28/2015   Lab Results  Component Value Date   LDLCALC 86 07/28/2015   Lab Results  Component Value Date   TRIG 119.0 07/28/2015   Lab Results  Component Value Date   CHOLHDL 3 07/28/2015   No results found for: HGBA1C     Assessment & Plan:   Problem List Items Addressed This Visit    Dyslipidemia    Encouraged heart healthy diet, increase exercise, avoid trans fats, consider a krill oil cap daily      Relevant Orders   Lipid panel   Comprehensive  metabolic panel   TSH   CBC   Cough    Travels a great deal given a prescription for Zpak to use prn if symptoms worsen.      Preventative health care    Patient encouraged to maintain heart healthy diet, regular exercise, adequate sleep. Consider daily probiotics. Take medications as prescribed. Given and reviewed copy of ACP documents from [employer] and encouraged to complete and return. Referred to GI for screening colonoscopy  Relevant Orders   Lipid panel   Comprehensive metabolic panel   TSH   CBC    Other Visit Diagnoses    Colon cancer screening    -  Primary   Relevant Orders   Ambulatory referral to Gastroenterology      I am having Mr. Look start on azithromycin. I am also having him maintain his aspirin, multivitamin, and VIAGRA.  Meds ordered this encounter  Medications  . azithromycin (ZITHROMAX) 250 MG tablet    Sig: 2 tabs po once then 1 tab po qd x 4 days    Dispense:  6 tablet    Refill:  1     Ailah Barna, MD

## 2015-08-13 NOTE — Assessment & Plan Note (Signed)
Patient encouraged to maintain heart healthy diet, regular exercise, adequate sleep. Consider daily probiotics. Take medications as prescribed. Given and reviewed copy of ACP documents from [employer] and encouraged to complete and return. Referred to GI for screening colonoscopy

## 2015-08-13 NOTE — Assessment & Plan Note (Signed)
Encouraged heart healthy diet, increase exercise, avoid trans fats, consider a krill oil cap daily 

## 2015-08-13 NOTE — Assessment & Plan Note (Signed)
Mario Young a great deal given a prescription for Zpak to use prn if symptoms worsen.

## 2015-09-29 ENCOUNTER — Encounter: Payer: Self-pay | Admitting: Gastroenterology

## 2015-09-29 ENCOUNTER — Ambulatory Visit (AMBULATORY_SURGERY_CENTER): Payer: Self-pay

## 2015-09-29 VITALS — Ht 71.0 in | Wt 182.2 lb

## 2015-09-29 DIAGNOSIS — Z8371 Family history of colonic polyps: Secondary | ICD-10-CM

## 2015-09-29 DIAGNOSIS — Z83719 Family history of colon polyps, unspecified: Secondary | ICD-10-CM

## 2015-09-29 MED ORDER — SUPREP BOWEL PREP KIT 17.5-3.13-1.6 GM/177ML PO SOLN: 1.0000 | Freq: Once | ORAL | 0 refills | Status: AC

## 2015-09-29 NOTE — Progress Notes (Signed)
No allergies to eggs or soy No past problems with anesthesia No diet meds No home oxygen  Declined emmi 

## 2015-10-13 ENCOUNTER — Ambulatory Visit (AMBULATORY_SURGERY_CENTER): Payer: Managed Care, Other (non HMO) | Admitting: Gastroenterology

## 2015-10-13 ENCOUNTER — Encounter: Payer: Self-pay | Admitting: Gastroenterology

## 2015-10-13 VITALS — BP 121/72 | HR 51 | Temp 97.7°F | Resp 14 | Ht 71.0 in | Wt 182.0 lb

## 2015-10-13 DIAGNOSIS — Z1212 Encounter for screening for malignant neoplasm of rectum: Secondary | ICD-10-CM

## 2015-10-13 DIAGNOSIS — D124 Benign neoplasm of descending colon: Secondary | ICD-10-CM | POA: Diagnosis not present

## 2015-10-13 DIAGNOSIS — Z1211 Encounter for screening for malignant neoplasm of colon: Secondary | ICD-10-CM | POA: Diagnosis present

## 2015-10-13 DIAGNOSIS — K635 Polyp of colon: Secondary | ICD-10-CM | POA: Diagnosis not present

## 2015-10-13 DIAGNOSIS — D120 Benign neoplasm of cecum: Secondary | ICD-10-CM

## 2015-10-13 DIAGNOSIS — D126 Benign neoplasm of colon, unspecified: Secondary | ICD-10-CM | POA: Diagnosis not present

## 2015-10-13 DIAGNOSIS — Z8371 Family history of colonic polyps: Secondary | ICD-10-CM | POA: Diagnosis not present

## 2015-10-13 DIAGNOSIS — Z86010 Personal history of colonic polyps: Secondary | ICD-10-CM

## 2015-10-13 MED ORDER — SODIUM CHLORIDE 0.9 % IV SOLN: 500.0000 mL | INTRAVENOUS | Status: DC

## 2015-10-13 NOTE — Op Note (Signed)
[facility] Patient Name: Mario Young Procedure Date: 10/13/2015 8:28 AM MRN: [id number] Endoscopist: Remo Lipps P. Havery Moros , MD Age: 65 Referring MD:  Date of Birth: 1950/07/09 Gender: Male Account #: [id number] Procedure:                Colonoscopy Indications:              Screening for malignant neoplasm in the colon Medicines:                Monitored Anesthesia Care Procedure:                Pre-Anesthesia Assessment:                           - Prior to the procedure, a History and Physical                            was performed, and patient medications and                            allergies were reviewed. The patient's tolerance of                            previous anesthesia was also reviewed. The risks                            and benefits of the procedure and the sedation                            options and risks were discussed with the patient.                            All questions were answered, and informed consent                            was obtained. Prior Anticoagulants: The patient has                            taken aspirin, last dose was 1 day prior to                            procedure. ASA Grade Assessment: II - A patient                            with mild systemic disease. After reviewing the                            risks and benefits, the patient was deemed in                            satisfactory condition to undergo the procedure.                           After obtaining informed consent, the colonoscope  was passed under direct vision. Throughout the                            procedure, the patient's blood pressure, pulse, and                            oxygen saturations were monitored continuously. The                            Model CF-HQ190L [id number]) scope was introduced                            through the anus and advanced to the the cecum,                            identified by  appendiceal orifice and ileocecal                            valve. The colonoscopy was performed without                            difficulty. The patient tolerated the procedure                            well. The quality of the bowel preparation was                            good. The ileocecal valve, appendiceal orifice, and                            rectum were photographed. Scope In: 8:35:28 AM Scope Out: 8:53:05 AM Scope Withdrawal Time: 0 hours 15 minutes 25 seconds  Total Procedure Duration: 0 hours 17 minutes 37 seconds  Findings:                 The perianal and digital rectal examinations were                            normal.                           A 6 mm polyp was found in the ileocecal valve. The                            polyp was sessile. The polyp was removed with a                            cold snare. Resection and retrieval were complete.                           A 5 mm polyp was found in the descending colon. The                            polyp was sessile. The polyp was removed  with a                            cold snare. Resection and retrieval were complete.                           A few small-mouthed diverticula were found in the                            left colon.                           Non-bleeding internal hemorrhoids were found during                            retroflexion. The hemorrhoids were moderate.                           The exam was otherwise without abnormality. Complications:            No immediate complications. Estimated blood loss:                            Minimal. Estimated Blood Loss:     Estimated blood loss was minimal. Impression:               - One 6 mm polyp at the ileocecal valve, removed                            with a cold snare. Resected and retrieved.                           - One 5 mm polyp in the descending colon, removed                            with a cold snare. Resected and retrieved.                            - Diverticulosis in the left colon.                           - Non-bleeding internal hemorrhoids.                           - The examination was otherwise normal. Recommendation:           - Patient has a contact number available for                            emergencies. The signs and symptoms of potential                            delayed complications were discussed with the                            patient. Return to normal activities tomorrow.  Written discharge instructions were provided to the                            patient.                           - Resume previous diet.                           - Continue present medications.                           - No ibuprofen, naproxen, or other non-steroidal                            anti-inflammatory drugs for 2 weeks after polyp                            removal.                           - Await pathology results.                           - Repeat colonoscopy is recommended for                            surveillance. The colonoscopy date will be                            determined after pathology results from today's                            exam become available for review. Remo Lipps P. Brach Birdsall, MD 10/13/2015 8:56:26 AM This report has been signed electronically.

## 2015-10-13 NOTE — Progress Notes (Signed)
Called to room to assist during endoscopic procedure.  Patient ID and intended procedure confirmed with present staff. Received instructions for my participation in the procedure from the performing physician.  

## 2015-10-13 NOTE — Progress Notes (Signed)
Report given to PACU RN, vss 

## 2015-10-13 NOTE — Patient Instructions (Signed)
Discharge instructions given. Handouts on polyps,diverticulosis and hemorrhoids. Resume previous medications. YOU HAD AN ENDOSCOPIC PROCEDURE TODAY AT THE [facility]:   Refer to the procedure report that was given to you for any specific questions about what was found during the examination.  If the procedure report does not answer your questions, please call your gastroenterologist to clarify.  If you requested that your care partner not be given the details of your procedure findings, then the procedure report has been included in a sealed envelope for you to review at your convenience later.  YOU SHOULD EXPECT: Some feelings of bloating in the abdomen. Passage of more gas than usual.  Walking can help get rid of the air that was put into your GI tract during the procedure and reduce the bloating. If you had a lower endoscopy (such as a colonoscopy or flexible sigmoidoscopy) you may notice spotting of blood in your stool or on the toilet paper. If you underwent a bowel prep for your procedure, you may not have a normal bowel movement for a few days.  Please Note:  You might notice some irritation and congestion in your nose or some drainage.  This is from the oxygen used during your procedure.  There is no need for concern and it should clear up in a day or so.  SYMPTOMS TO REPORT IMMEDIATELY:   Following lower endoscopy (colonoscopy or flexible sigmoidoscopy):  Excessive amounts of blood in the stool  Significant tenderness or worsening of abdominal pains  Swelling of the abdomen that is new, acute  Fever of 100F or higher   For urgent or emergent issues, a gastroenterologist can be reached at any hour by calling [phone].   DIET:  We do recommend a small meal at first, but then you may proceed to your regular diet.  Drink plenty of fluids but you should avoid alcoholic beverages for 24 hours.  ACTIVITY:  You should plan to take it easy for the rest of today and you  should NOT DRIVE or use heavy machinery until tomorrow (because of the sedation medicines used during the test).    FOLLOW UP: Our staff will call the number listed on your records the next business day following your procedure to check on you and address any questions or concerns that you may have regarding the information given to you following your procedure. If we do not reach you, we will leave a message.  However, if you are feeling well and you are not experiencing any problems, there is no need to return our call.  We will assume that you have returned to your regular daily activities without incident.  If any biopsies were taken you will be contacted by phone or by letter within the next 1-3 weeks.  Please call us at [phone] if you have not heard about the biopsies in 3 weeks.    SIGNATURES/CONFIDENTIALITY: You and/or your care partner have signed paperwork which will be entered into your electronic medical record.  These signatures attest to the fact that that the information above on your After Visit Summary has been reviewed and is understood.  Full responsibility of the confidentiality of this discharge information lies with you and/or your care-partner. 

## 2015-10-14 ENCOUNTER — Telehealth: Payer: Self-pay

## 2015-10-14 NOTE — Telephone Encounter (Signed)
  Follow up Call-  Call back number 10/13/2015  Post procedure Call Back phone  # [phone] cell  Permission to leave phone message Yes  Some recent data might be hidden     Patient questions:  Do you have a fever, pain , or abdominal swelling? No. Pain Score  0 *  Have you tolerated food without any problems? Yes.    Have you been able to return to your normal activities? Yes.    Do you have any questions about your discharge instructions: Diet   No. Medications  No. Follow up visit  No.  Do you have questions or concerns about your Care? No.  Actions: * If pain score is 4 or above: No action needed, pain <4.

## 2015-10-18 ENCOUNTER — Encounter: Payer: Self-pay | Admitting: Gastroenterology

## 2015-10-21 ENCOUNTER — Telehealth: Payer: Self-pay | Admitting: Gastroenterology

## 2015-10-21 NOTE — Telephone Encounter (Signed)
Patient advised of results, let him know that he should be getting a letter in the mail. Patient was confused as MyChart says that he can see his results but when he goes to it unable to.

## 2016-08-02 ENCOUNTER — Other Ambulatory Visit (INDEPENDENT_AMBULATORY_CARE_PROVIDER_SITE_OTHER): Payer: Managed Care, Other (non HMO)

## 2016-08-02 DIAGNOSIS — E785 Hyperlipidemia, unspecified: Secondary | ICD-10-CM | POA: Diagnosis not present

## 2016-08-02 DIAGNOSIS — Z0000 Encounter for general adult medical examination without abnormal findings: Secondary | ICD-10-CM | POA: Diagnosis not present

## 2016-08-02 LAB — COMPREHENSIVE METABOLIC PANEL
ALK PHOS: 97 U/L (ref 39–117)
ALT: 19 U/L (ref 0–53)
AST: 26 U/L (ref 0–37)
Albumin: 4.4 g/dL (ref 3.5–5.2)
BILIRUBIN TOTAL: 0.6 mg/dL (ref 0.2–1.2)
BUN: 14 mg/dL (ref 6–23)
CO2: 28 meq/L (ref 19–32)
CREATININE: 0.84 mg/dL (ref 0.40–1.50)
Calcium: 9.4 mg/dL (ref 8.4–10.5)
Chloride: 104 mEq/L (ref 96–112)
GFR: 97.11 mL/min (ref >60.00)
GLUCOSE: 98 mg/dL (ref 70–99)
Potassium: 4.7 mEq/L (ref 3.5–5.1)
SODIUM: 139 meq/L (ref 135–145)
Total Protein: 7.1 g/dL (ref 6.0–8.3)

## 2016-08-02 LAB — CBC
HEMATOCRIT: 45.2 % (ref 39.0–52.0)
Hemoglobin: 15 g/dL (ref 13.0–17.0)
MCHC: 33.1 g/dL (ref 30.0–36.0)
MCV: 92.2 fl (ref 78.0–100.0)
Platelets: 240 10*3/uL (ref 150.0–400.0)
RBC: 4.9 Mil/uL (ref 4.22–5.81)
RDW: 13.3 % (ref 11.5–15.5)
WBC: 4.7 10*3/uL (ref 4.0–10.5)

## 2016-08-02 LAB — LIPID PANEL
CHOL/HDL RATIO: 3
Cholesterol: 184 mg/dL (ref 0–200)
HDL: 64.6 mg/dL (ref >39.00)
LDL Cholesterol: 107 mg/dL — ABNORMAL HIGH (ref 0–99)
NONHDL: 119.5
Triglycerides: 64 mg/dL (ref 0.0–149.0)
VLDL: 12.8 mg/dL (ref 0.0–40.0)

## 2016-08-02 LAB — TSH: TSH: 2.15 u[IU]/mL (ref 0.35–4.50)

## 2016-08-03 ENCOUNTER — Other Ambulatory Visit: Payer: Managed Care, Other (non HMO)

## 2016-08-09 ENCOUNTER — Encounter: Payer: Managed Care, Other (non HMO) | Admitting: Family Medicine

## 2016-10-11 ENCOUNTER — Encounter: Payer: Managed Care, Other (non HMO) | Admitting: Family Medicine

## 2017-01-10 ENCOUNTER — Encounter: Payer: Managed Care, Other (non HMO) | Admitting: Family Medicine

## 2017-02-18 ENCOUNTER — Encounter: Payer: Managed Care, Other (non HMO) | Admitting: Family Medicine

## 2017-03-04 ENCOUNTER — Encounter: Payer: Managed Care, Other (non HMO) | Admitting: Family Medicine

## 2017-03-04 ENCOUNTER — Other Ambulatory Visit: Payer: Self-pay

## 2017-03-04 MED ORDER — AZITHROMYCIN 250 MG PO TABS: ORAL_TABLET | ORAL | 0 refills | Status: DC

## 2017-03-04 MED ORDER — VIAGRA 50 MG PO TABS: 50.0000 mg | ORAL_TABLET | ORAL | 0 refills | Status: DC | PRN

## 2017-03-04 NOTE — Telephone Encounter (Signed)
[place] the [place] and just needs a preventive. Pt is asking for ZPak and refill on VIAGRA 50 MG

## 2017-04-18 ENCOUNTER — Ambulatory Visit (INDEPENDENT_AMBULATORY_CARE_PROVIDER_SITE_OTHER): Payer: Managed Care, Other (non HMO) | Admitting: Family Medicine

## 2017-04-18 DIAGNOSIS — N529 Male erectile dysfunction, unspecified: Secondary | ICD-10-CM | POA: Diagnosis not present

## 2017-04-18 DIAGNOSIS — Z23 Encounter for immunization: Secondary | ICD-10-CM | POA: Diagnosis not present

## 2017-04-18 DIAGNOSIS — Z0000 Encounter for general adult medical examination without abnormal findings: Secondary | ICD-10-CM

## 2017-04-18 DIAGNOSIS — E785 Hyperlipidemia, unspecified: Secondary | ICD-10-CM | POA: Diagnosis not present

## 2017-04-18 MED ORDER — SILDENAFIL CITRATE 20 MG PO TABS: 20.0000 mg | ORAL_TABLET | Freq: Every day | ORAL | 1 refills | Status: AC | PRN

## 2017-04-18 NOTE — Assessment & Plan Note (Addendum)
Patient encouraged to maintain heart healthy diet, regular exercise, adequate sleep. Consider daily probiotics. Take medications as prescribed. Is moving to [place] and is encouraged to proceed with new Advanced Directives that are state specific. Given Prevnar today

## 2017-04-18 NOTE — Progress Notes (Signed)
duplicate

## 2017-04-18 NOTE — Progress Notes (Signed)
Patient ID: Mario Young, male   DOB: 06-21-1950, 67 y.o.   MRN: [id number]   Subjective:    Patient ID: Mario Young, male    DOB: 1950/03/08, 67 y.o.   MRN: [id number]  No chief complaint on file.   HPI Patient is in today for annual preventative exam. He is doing well. He is in the process of trying to sell his house and move to [place] to be near his grandchildren. The only refill he needs is Sildenafil. No recent febrile illness or hospitalizations. He is eating well, staying active and has no trouble with his ADLs. He has Advanced directives and is encouraged to update them once he has relocated to [place]. Denies CP/palp/SOB/HA/congestion/fevers/GI or GU c/o. Taking meds as prescribed  Past Medical History:  Diagnosis Date  . Erectile dysfunction 04/28/2014  . H/O sinusitis 04/22/2014  . Hyperlipidemia    no meds/in past  . Preventative health care 04/28/2014  . Shingles     Past Surgical History:  Procedure Laterality Date  . COLONOSCOPY    . NO PAST SURGERIES    . WISDOM TOOTH EXTRACTION      Family History  Problem Relation Age of Onset  . Hyperlipidemia Mother   . Heart disease Father        CHF  . Kidney disease Father   . Colon polyps Father   . Stomach cancer Father        dx in 64 pt's father  . Heart disease Paternal Uncle   . Colon cancer Neg Hx   . Esophageal cancer Neg Hx   . Pancreatic cancer Neg Hx   . Prostate cancer Neg Hx   . Rectal cancer Neg Hx     Social History   Socioeconomic History  . Marital status: Married    Spouse name: Not on file  . Number of children: 1  . Years of education: 58  . Highest education level: Not on file  Occupational History  . Occupation: Geographical information systems officer  . Financial resource strain: Not on file  . Food insecurity:    Worry: Not on file    Inability: Not on file  . Transportation needs:    Medical: Not on file    Non-medical: Not on file  Tobacco Use  . Smoking status: Never Smoker  .  Smokeless tobacco: Never Used  Substance and Sexual Activity  . Alcohol use: Yes    Alcohol/week: 2.4 oz    Types: 4 Standard drinks or equivalent per week    Comment: socially, weekends only  . Drug use: No  . Sexual activity: Yes    Comment: lives with wife, ltravels for work in [employer], mediterranean exercises  Lifestyle  . Physical activity:    Days per week: Not on file    Minutes per session: Not on file  . Stress: Not on file  Relationships  . Social connections:    Talks on phone: Not on file    Gets together: Not on file    Attends religious service: Not on file    Active member of club or organization: Not on file    Attends meetings of clubs or organizations: Not on file    Relationship status: Not on file  . Intimate partner violence:    Fear of current or ex partner: Not on file    Emotionally abused: Not on file    Physically abused: Not on file    Forced sexual activity:  Not on file  Other Topics Concern  . Not on file  Social History Narrative  . Not on file    Outpatient Medications Prior to Visit  Medication Sig Dispense Refill  . aspirin 81 MG tablet Take 81 mg by mouth daily.      . Multiple Vitamin (MULTIVITAMIN) tablet Take 1 tablet by mouth daily.      Marland Kitchen azithromycin (ZITHROMAX) 250 MG tablet Take 2 tab po the first day then take 1 tablet po daily for 4 days 6 tablet 0  . VIAGRA 50 MG tablet Take 1 tablet (50 mg total) by mouth as needed. 10 tablet 0  . 0.9 %  sodium chloride infusion      No facility-administered medications prior to visit.     No Known Allergies  Review of Systems  Constitutional: Negative for fever and malaise/fatigue.  HENT: Negative for congestion.   Eyes: Negative for blurred vision.  Respiratory: Negative for shortness of breath.   Cardiovascular: Negative for chest pain, palpitations and leg swelling.  Gastrointestinal: Negative for abdominal pain, blood in stool and nausea.  Genitourinary: Negative for  dysuria and frequency.  Musculoskeletal: Negative for falls.  Skin: Negative for rash.  Neurological: Negative for dizziness, loss of consciousness and headaches.  Endo/Heme/Allergies: Negative for environmental allergies.  Psychiatric/Behavioral: Negative for depression. The patient is not nervous/anxious.        Objective:    Physical Exam  Constitutional: He is oriented to person, place, and time. He appears well-developed and well-nourished. No distress.  HENT:  Head: Normocephalic and atraumatic.  Nose: Nose normal.  Eyes: Right eye exhibits no discharge. Left eye exhibits no discharge.  Neck: Normal range of motion. Neck supple.  Cardiovascular: Normal rate and regular rhythm.  No murmur heard. Pulmonary/Chest: Effort normal and breath sounds normal.  Abdominal: Soft. Bowel sounds are normal. There is no tenderness.  Musculoskeletal: He exhibits no edema.  Neurological: He is alert and oriented to person, place, and time.  Skin: Skin is dry.  Psychiatric: He has a normal mood and affect.  Nursing note and vitals reviewed.   There were no vitals taken for this visit. Wt Readings from Last 3 Encounters:  10/13/15 182 lb (82.6 kg)  09/29/15 182 lb 3.2 oz (82.6 kg)  08/04/15 176 lb 6 oz (80 kg)     Lab Results  Component Value Date   WBC 4.7 08/02/2016   HGB 15.0 08/02/2016   HCT 45.2 08/02/2016   PLT 240.0 08/02/2016   GLUCOSE 98 08/02/2016   CHOL 184 08/02/2016   TRIG 64.0 08/02/2016   HDL 64.60 08/02/2016   LDLDIRECT 145.6 12/04/2010   LDLCALC 107 (H) 08/02/2016   ALT 19 08/02/2016   AST 26 08/02/2016   NA 139 08/02/2016   K 4.7 08/02/2016   CL 104 08/02/2016   CREATININE 0.84 08/02/2016   BUN 14 08/02/2016   CO2 28 08/02/2016   TSH 2.15 08/02/2016   PSA 1.20 12/07/2010    Lab Results  Component Value Date   TSH 2.15 08/02/2016   Lab Results  Component Value Date   WBC 4.7 08/02/2016   HGB 15.0 08/02/2016   HCT 45.2 08/02/2016   MCV 92.2  08/02/2016   PLT 240.0 08/02/2016   Lab Results  Component Value Date   NA 139 08/02/2016   K 4.7 08/02/2016   CO2 28 08/02/2016   GLUCOSE 98 08/02/2016   BUN 14 08/02/2016   CREATININE 0.84 08/02/2016   BILITOT 0.6  08/02/2016   ALKPHOS 97 08/02/2016   AST 26 08/02/2016   ALT 19 08/02/2016   PROT 7.1 08/02/2016   ALBUMIN 4.4 08/02/2016   CALCIUM 9.4 08/02/2016   GFR 97.11 08/02/2016   Lab Results  Component Value Date   CHOL 184 08/02/2016   Lab Results  Component Value Date   HDL 64.60 08/02/2016   Lab Results  Component Value Date   LDLCALC 107 (H) 08/02/2016   Lab Results  Component Value Date   TRIG 64.0 08/02/2016   Lab Results  Component Value Date   CHOLHDL 3 08/02/2016   No results found for: HGBA1C     Assessment & Plan:   Problem List Items Addressed This Visit    Dyslipidemia    Encouraged heart healthy diet, increase exercise, avoid trans fats, consider a krill oil cap daily      Preventative health care - Primary    Patient encouraged to maintain heart healthy diet, regular exercise, adequate sleep. Consider daily probiotics. Take medications as prescribed. Is moving to [place] and is encouraged to proceed with new Advanced Directives that are state specific. Given Prevnar today      Erectile dysfunction    Given refill on Sildenafil         I have discontinued Osa Craver. Seda's VIAGRA and azithromycin. I am also having him start on sildenafil. Additionally, I am having him maintain his aspirin and multivitamin. We will stop administering sodium chloride.  Meds ordered this encounter  Medications  . sildenafil (REVATIO) 20 MG tablet    Sig: Take 1-4 tablets (20-80 mg total) by mouth daily as needed.    Dispense:  30 tablet    Refill:  1    CMA served as scribe during this visit. History, Physical and Plan performed by medical provider. Documentation and orders reviewed and attested to.  Penni Homans, MD

## 2017-04-18 NOTE — Patient Instructions (Addendum)
CoverMyMeds GoodRx  [facility]  Shingrix is the new shingles sot, 2 shots over 2-6 months, check with insurance to insure they will cover Preventive Care 65 Years and Older, Male Preventive care refers to lifestyle choices and visits with your health care provider that can promote health and wellness. What does preventive care include?  A yearly physical exam. This is also called an annual well check.  Dental exams once or twice a year.  Routine eye exams. Ask your health care provider how often you should have your eyes checked.  Personal lifestyle choices, including: ? Daily care of your teeth and gums. ? Regular physical activity. ? Eating a healthy diet. ? Avoiding tobacco and drug use. ? Limiting alcohol use. ? Practicing safe sex. ? Taking low doses of aspirin every day. ? Taking vitamin and mineral supplements as recommended by your health care provider. What happens during an annual well check? The services and screenings done by your health care provider during your annual well check will depend on your age, overall health, lifestyle risk factors, and family history of disease. Counseling Your health care provider may ask you questions about your:  Alcohol use.  Tobacco use.  Drug use.  Emotional well-being.  Home and relationship well-being.  Sexual activity.  Eating habits.  History of falls.  Memory and ability to understand (cognition).  Work and work Statistician.  Screening You may have the following tests or measurements:  Height, weight, and BMI.  Blood pressure.  Lipid and cholesterol levels. These may be checked every 5 years, or more frequently if you are over 26 years old.  Skin check.  Lung cancer screening. You may have this screening every year starting at age 29 if you have a 30-pack-year history of smoking and currently smoke or have quit within the past 15 years.  Fecal occult blood test (FOBT) of  the stool. You may have this test every year starting at age 52.  Flexible sigmoidoscopy or colonoscopy. You may have a sigmoidoscopy every 5 years or a colonoscopy every 10 years starting at age 49.  Prostate cancer screening. Recommendations will vary depending on your family history and other risks.  Hepatitis C blood test.  Hepatitis B blood test.  Sexually transmitted disease (STD) testing.  Diabetes screening. This is done by checking your blood sugar (glucose) after you have not eaten for a while (fasting). You may have this done every 1-3 years.  Abdominal aortic aneurysm (AAA) screening. You may need this if you are a current or former smoker.  Osteoporosis. You may be screened starting at age 11 if you are at high risk.  Talk with your health care provider about your test results, treatment options, and if necessary, the need for more tests. Vaccines Your health care provider may recommend certain vaccines, such as:  Influenza vaccine. This is recommended every year.  Tetanus, diphtheria, and acellular pertussis (Tdap, Td) vaccine. You may need a Td booster every 10 years.  Varicella vaccine. You may need this if you have not been vaccinated.  Zoster vaccine. You may need this after age 62.  Measles, mumps, and rubella (MMR) vaccine. You may need at least one dose of MMR if you were born in 1957 or later. You may also need a second dose.  Pneumococcal 13-valent conjugate (PCV13) vaccine. One dose is recommended after age 45.  Pneumococcal polysaccharide (PPSV23) vaccine. One dose is recommended after age 67.  Meningococcal vaccine. You may need this if  you have certain conditions.  Hepatitis A vaccine. You may need this if you have certain conditions or if you travel or work in places where you may be exposed to hepatitis A.  Hepatitis B vaccine. You may need this if you have certain conditions or if you travel or work in places where you may be exposed to hepatitis  B.  Haemophilus influenzae type b (Hib) vaccine. You may need this if you have certain risk factors.  Talk to your health care provider about which screenings and vaccines you need and how often you need them. This information is not intended to replace advice given to you by your health care provider. Make sure you discuss any questions you have with your health care provider. Document Released: 01/17/2015 Document Revised: 09/10/2015 Document Reviewed: 10/22/2014 Elsevier Interactive Patient Education  [employer].

## 2017-04-18 NOTE — Assessment & Plan Note (Signed)
Encouraged heart healthy diet, increase exercise, avoid trans fats, consider a krill oil cap daily 

## 2017-04-18 NOTE — Assessment & Plan Note (Signed)
Given refill on Sildenafil ?

## 2017-04-25 NOTE — Assessment & Plan Note (Signed)
Encouraged to proceed with Shingrix when available.

## 2017-09-13 ENCOUNTER — Telehealth: Payer: Self-pay

## 2017-09-13 MED ORDER — AZITHROMYCIN 250 MG PO TABS: ORAL_TABLET | ORAL | 0 refills | Status: AC

## 2017-09-13 MED ORDER — BENZONATATE 200 MG PO CAPS: 200.0000 mg | ORAL_CAPSULE | Freq: Two times a day (BID) | ORAL | 0 refills | Status: AC | PRN

## 2017-09-13 NOTE — Telephone Encounter (Signed)
Ok to rx a Zpak and Tessalon perles but if no improvement in a week or he worsens he needs to be seen for further work up. Please send in and let him kno

## 2017-09-13 NOTE — Telephone Encounter (Signed)
Spoke with patient about message below he voiced his understanding  Medication sent in

## 2017-09-13 NOTE — Telephone Encounter (Signed)
Copied from [place] [phone]. Topic: General - Other >> Sep 13, 2017  8:44 AM Carolyn Stare wrote: Pt said he been in [place] for 2 weeks and now he is back and has a sinus infection and is requesting a Zpac  and something for a cough   Pharmacy  [facility] [address]     Please advise

## 2018-04-24 ENCOUNTER — Encounter: Payer: Managed Care, Other (non HMO) | Admitting: Family Medicine

## 2022-11-18 ENCOUNTER — Encounter: Payer: Self-pay | Admitting: Gastroenterology
